# Patient Record
Sex: Female | Born: 1950
Health system: Southern US, Community
[De-identification: ages and names within clinical notes are randomized; demographics above are authoritative.]

## PROBLEM LIST (undated history)

## (undated) DIAGNOSIS — K8020 Calculus of gallbladder without cholecystitis without obstruction: Secondary | ICD-10-CM

## (undated) DIAGNOSIS — H43811 Vitreous degeneration, right eye: Secondary | ICD-10-CM

## (undated) DIAGNOSIS — K5790 Diverticulosis of intestine, part unspecified, without perforation or abscess without bleeding: Secondary | ICD-10-CM

## (undated) DIAGNOSIS — R7301 Impaired fasting glucose: Secondary | ICD-10-CM

## (undated) DIAGNOSIS — Z87898 Personal history of other specified conditions: Secondary | ICD-10-CM

## (undated) DIAGNOSIS — M1711 Unilateral primary osteoarthritis, right knee: Secondary | ICD-10-CM

## (undated) DIAGNOSIS — C4491 Basal cell carcinoma of skin, unspecified: Secondary | ICD-10-CM

## (undated) DIAGNOSIS — J309 Allergic rhinitis, unspecified: Secondary | ICD-10-CM

## (undated) DIAGNOSIS — I10 Essential (primary) hypertension: Secondary | ICD-10-CM

## (undated) DIAGNOSIS — N182 Chronic kidney disease, stage 2 (mild): Secondary | ICD-10-CM

## (undated) DIAGNOSIS — D170 Benign lipomatous neoplasm of skin and subcutaneous tissue of head, face and neck: Secondary | ICD-10-CM

## (undated) DIAGNOSIS — M65332 Trigger finger, left middle finger: Secondary | ICD-10-CM

## (undated) DIAGNOSIS — F419 Anxiety disorder, unspecified: Secondary | ICD-10-CM

## (undated) DIAGNOSIS — J302 Other seasonal allergic rhinitis: Secondary | ICD-10-CM

## (undated) DIAGNOSIS — K219 Gastro-esophageal reflux disease without esophagitis: Secondary | ICD-10-CM

## (undated) HISTORY — DX: Diverticulosis of intestine, part unspecified, without perforation or abscess without bleeding: K57.90

## (undated) HISTORY — DX: Calculus of gallbladder without cholecystitis without obstruction: K80.20

## (undated) HISTORY — DX: Vitreous degeneration, right eye: H43.811

## (undated) HISTORY — DX: Chronic kidney disease, stage 2 (mild): N18.2

## (undated) HISTORY — DX: Impaired fasting glucose: R73.01

## (undated) HISTORY — DX: Other seasonal allergic rhinitis: J30.2

## (undated) HISTORY — DX: Essential (primary) hypertension: I10

## (undated) HISTORY — DX: Basal cell carcinoma of skin, unspecified: C44.91

## (undated) HISTORY — DX: Gastro-esophageal reflux disease without esophagitis: K21.9

## (undated) HISTORY — PX: COLONOSCOPY: SHX174

## (undated) HISTORY — DX: Benign lipomatous neoplasm of skin and subcutaneous tissue of head, face and neck: D17.0

## (undated) HISTORY — DX: Trigger finger, left middle finger: M65.332

## (undated) HISTORY — DX: Allergic rhinitis, unspecified: J30.9

## (undated) HISTORY — DX: Anxiety disorder, unspecified: F41.9

## (undated) HISTORY — DX: Unilateral primary osteoarthritis, right knee: M17.11

## (undated) HISTORY — DX: Personal history of other specified conditions: Z87.898

---
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
## 1973-09-16 HISTORY — PX: PILONIDAL CYST EXCISION: SHX744

## 2008-05-05 ENCOUNTER — Other Ambulatory Visit: Admission: RE | Admit: 2008-05-05 | Discharge: 2008-05-05 | Payer: Self-pay | Admitting: Family Medicine

## 2008-05-12 ENCOUNTER — Encounter: Admission: RE | Admit: 2008-05-12 | Discharge: 2008-05-12 | Payer: Self-pay | Admitting: Family Medicine

## 2011-07-12 HISTORY — PX: OTHER SURGICAL HISTORY: SHX169

## 2011-08-05 HISTORY — PX: OTHER SURGICAL HISTORY: SHX169

## 2012-12-23 DIAGNOSIS — F419 Anxiety disorder, unspecified: Secondary | ICD-10-CM | POA: Insufficient documentation

## 2012-12-23 DIAGNOSIS — I10 Essential (primary) hypertension: Secondary | ICD-10-CM | POA: Insufficient documentation

## 2014-01-14 DIAGNOSIS — M1711 Unilateral primary osteoarthritis, right knee: Secondary | ICD-10-CM

## 2014-01-14 HISTORY — DX: Unilateral primary osteoarthritis, right knee: M17.11

## 2014-02-08 DIAGNOSIS — M1710 Unilateral primary osteoarthritis, unspecified knee: Secondary | ICD-10-CM | POA: Insufficient documentation

## 2014-02-08 DIAGNOSIS — M179 Osteoarthritis of knee, unspecified: Secondary | ICD-10-CM | POA: Insufficient documentation

## 2015-02-28 ENCOUNTER — Ambulatory Visit (INDEPENDENT_AMBULATORY_CARE_PROVIDER_SITE_OTHER): Payer: No Typology Code available for payment source | Admitting: Family Medicine

## 2015-02-28 ENCOUNTER — Encounter: Payer: Self-pay | Admitting: Family Medicine

## 2015-02-28 VITALS — BP 134/90 | HR 80 | Temp 98.0°F | Resp 16 | Ht 62.5 in | Wt 151.0 lb

## 2015-02-28 DIAGNOSIS — I10 Essential (primary) hypertension: Secondary | ICD-10-CM | POA: Diagnosis not present

## 2015-02-28 DIAGNOSIS — Z0000 Encounter for general adult medical examination without abnormal findings: Secondary | ICD-10-CM | POA: Diagnosis not present

## 2015-02-28 LAB — COMPREHENSIVE METABOLIC PANEL
ALK PHOS: 69 U/L (ref 39–117)
ALT: 27 U/L (ref 0–35)
AST: 28 U/L (ref 0–37)
Albumin: 4.6 g/dL (ref 3.5–5.2)
BILIRUBIN TOTAL: 1.2 mg/dL (ref 0.2–1.2)
BUN: 18 mg/dL (ref 6–23)
CO2: 28 mEq/L (ref 19–32)
Calcium: 9.9 mg/dL (ref 8.4–10.5)
Chloride: 106 mEq/L (ref 96–112)
Creatinine, Ser: 1.1 mg/dL (ref 0.40–1.20)
GFR: 53.04 mL/min — ABNORMAL LOW (ref >60.00)
Glucose, Bld: 79 mg/dL (ref 70–99)
POTASSIUM: 4.7 meq/L (ref 3.5–5.1)
SODIUM: 141 meq/L (ref 135–145)
TOTAL PROTEIN: 7.4 g/dL (ref 6.0–8.3)

## 2015-02-28 LAB — LIPID PANEL
CHOL/HDL RATIO: 4
Cholesterol: 171 mg/dL (ref 0–200)
HDL: 47.5 mg/dL (ref >39.00)
LDL Cholesterol: 107 mg/dL — ABNORMAL HIGH (ref 0–99)
NonHDL: 123.5
TRIGLYCERIDES: 84 mg/dL (ref 0.0–149.0)
VLDL: 16.8 mg/dL (ref 0.0–40.0)

## 2015-02-28 LAB — CBC WITH DIFFERENTIAL/PLATELET
BASOS ABS: 0 10*3/uL (ref 0.0–0.1)
Basophils Relative: 0.6 % (ref 0.0–3.0)
EOS PCT: 3.5 % (ref 0.0–5.0)
Eosinophils Absolute: 0.2 10*3/uL (ref 0.0–0.7)
HEMATOCRIT: 47.4 % — AB (ref 36.0–46.0)
Hemoglobin: 15.8 g/dL — ABNORMAL HIGH (ref 12.0–15.0)
Lymphocytes Relative: 36.4 % (ref 12.0–46.0)
Lymphs Abs: 2.1 10*3/uL (ref 0.7–4.0)
MCHC: 33.3 g/dL (ref 30.0–36.0)
MCV: 88 fl (ref 78.0–100.0)
Monocytes Absolute: 0.5 10*3/uL (ref 0.1–1.0)
Monocytes Relative: 9.4 % (ref 3.0–12.0)
Neutro Abs: 2.8 10*3/uL (ref 1.4–7.7)
Neutrophils Relative %: 50.1 % (ref 43.0–77.0)
Platelets: 336 10*3/uL (ref 150.0–400.0)
RBC: 5.38 Mil/uL — AB (ref 3.87–5.11)
RDW: 13.9 % (ref 11.5–15.5)
WBC: 5.6 10*3/uL (ref 4.0–10.5)

## 2015-02-28 LAB — TSH: TSH: 0.7 u[IU]/mL (ref 0.35–4.50)

## 2015-02-28 NOTE — Progress Notes (Signed)
Pre visit review using our clinic review tool, if applicable. No additional management support is needed unless otherwise documented below in the visit note. 

## 2015-02-28 NOTE — Progress Notes (Signed)
Office Note 02/28/2015  CC:  Chief Complaint  Patient presents with  . Establish Care    HPI:  Kelly Barnett is a 64 y.o. White female who is here to establish care. Patient's most recent primary MD: Dr. Hermine Messick in [place], [place]. Old records were not reviewed prior to or during today's visit.  She feels well. Monitors bp at home: says 130s/90 is common measurement for her. Says when bp is 115 on top she feels "lethargic".    Previous PCP put her on bp med and pt states she felt "lethargic" when bp was "normalized". She discontinued the med herself, doesn't recall the name of med.  She is not interested in med treatment of her mild HTN at this time. She does not eat low Na diet but says otherwise her diet is fairly healthy.   Past Medical History  Diagnosis Date  . Allergic rhinitis   . Hypertension   . Diverticulosis   . Lipoma of neck     R post cerv area    Past Surgical History  Procedure Laterality Date  . Eye muscle surgery Bilateral 1965  . Colonoscopy  11/07/05    Recall 10 yrs    Family History  Problem Relation Age of Onset  . Arthritis Mother   . Alcohol abuse Father   . Early death Father     ruptured blood vessel in throat  . Lung cancer Maternal Uncle   . Arthritis Maternal Grandmother   . Diabetes Maternal Grandmother   . Prostate cancer Maternal Grandfather     History   Social History  . Marital Status: Single    Spouse Name: N/A  . Number of Children: N/A  . Years of Education: N/A   Occupational History  . Not on file.   Social History Main Topics  . Smoking status: Never Smoker   . Smokeless tobacco: Never Used  . Alcohol Use: Yes  . Drug Use: No  . Sexual Activity: Not on file   Other Topics Concern  . Not on file   Social History Narrative   Single, moved to [place] from [place] in 2014 to be closer to her son and grandchildren.   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    Occ alcohol.   No tobacco or drugs.   Active in "play" but no formal exercise regimen.    Outpatient Encounter Prescriptions as of 02/28/2015  Medication Sig  . Aspirin-Caffeine (BACK & BODY EXTRA STRENGTH PO) Take by mouth.  . calcium carbonate (TUMS - DOSED IN MG ELEMENTAL CALCIUM) 500 MG chewable tablet Chew 1 tablet by mouth daily.  . magnesium oxide (MAG-OX) 400 MG tablet Take 400 mg by mouth daily.  . Multiple Vitamins-Minerals (WOMENS 50+ MULTI VITAMIN/MIN PO) Take 1 tablet by mouth daily.  . Safflower Oil (CLA) 1000 MG CAPS Take by mouth.   No facility-administered encounter medications on file as of 02/28/2015.    Allergies  Allergen Reactions  . Tetracyclines & Related Rash    ROS Review of Systems  Constitutional: Negative for fever, chills, appetite change and fatigue.  HENT: Negative for congestion, dental problem, ear pain and sore throat.   Eyes: Negative for discharge, redness and visual disturbance.  Respiratory: Negative for cough, chest tightness, shortness of breath and wheezing.   Cardiovascular: Negative for chest pain, palpitations and leg swelling.  Gastrointestinal: Negative for nausea, vomiting, abdominal pain, diarrhea and blood in stool.  Genitourinary: Negative for dysuria, urgency, frequency, hematuria, flank pain and difficulty urinating.  Musculoskeletal: Negative for myalgias, back pain, joint swelling, arthralgias and neck stiffness.  Skin: Negative for pallor and rash.  Neurological: Negative for dizziness, speech difficulty, weakness and headaches.  Hematological: Negative for adenopathy. Does not bruise/bleed easily.  Psychiatric/Behavioral: Negative for confusion and sleep disturbance. The patient is not nervous/anxious.     PE; Blood pressure 134/90, pulse 80, temperature 98 F (36.7 C), temperature source Oral, resp. rate 16, height 5' 2.5" (1.588 m), weight 151 lb (68.493 kg), SpO2 97 %. Gen: Alert, well appearing.  Patient is oriented  to person, place, time, and situation. AFFECT: pleasant, lucid thought and speech. ENT: Ears: EACs clear, normal epithelium.  TMs with good light reflex and landmarks bilaterally.  Eyes: no injection, icteris, swelling, or exudate.  EOMI, PERRLA. Nose: no drainage or turbinate edema/swelling.  No injection or focal lesion.  Mouth: lips without lesion/swelling.  Oral mucosa pink and moist.  Dentition intact and without obvious caries or gingival swelling.  Oropharynx without erythema, exudate, or swelling.  Neck: supple/nontender.  No LAD, mass, or TM.  Carotid pulses 2+ bilaterally, without bruits. CV: RRR, no m/r/g.   LUNGS: CTA bilat, nonlabored resps, good aeration in all lung fields. ABD: soft, NT, ND, BS normal.  No hepatospenomegaly or mass.  No bruits. EXT: no clubbing, cyanosis, or edema.  Musculoskeletal: no joint swelling, erythema, warmth, or tenderness.  ROM of all joints intact. Skin - no sores or suspicious lesions or rashes or color changes  Pertinent labs:  None today  ASSESSMENT AND PLAN:   New pt; obtain prior pcp records.  1) Stage I HTN, mild. OK to proceed with low Na diet, increase exercise, continue home monitoring, no meds at this time. If gets persistently >419 systolic or >37 diastolic pt agrees to another trial of medication. Check BMET today.  2) Reviewed age and gender appropriate health maintenance issues (prudent diet, regular exercise, health risks of tobacco and excessive alcohol, use of seatbelts, fire alarms in home, use of sunscreen).  Also reviewed age and gender appropriate health screening as well as vaccine recommendations. HP (fasting) labs today. No vaccines due at this time. She'll get mammogram in 08/2015, and she'll return that same month to get pap/pelvic exam here.  An After Visit Summary was printed and given to the patient.  Return in about 5 months (around 07/31/2015) for 30 min appt for pap/pelvic.

## 2015-03-13 ENCOUNTER — Encounter: Payer: Self-pay | Admitting: Internal Medicine

## 2015-07-18 ENCOUNTER — Encounter: Payer: Self-pay | Admitting: Family Medicine

## 2015-08-09 ENCOUNTER — Ambulatory Visit (INDEPENDENT_AMBULATORY_CARE_PROVIDER_SITE_OTHER): Payer: Medicare Other | Admitting: Family Medicine

## 2015-08-09 ENCOUNTER — Encounter: Payer: Self-pay | Admitting: Family Medicine

## 2015-08-09 ENCOUNTER — Other Ambulatory Visit (HOSPITAL_COMMUNITY)
Admission: RE | Admit: 2015-08-09 | Discharge: 2015-08-09 | Disposition: A | Discharge status: Home / Self Care | Payer: Medicare Other | Source: Ambulatory Visit | Attending: Family Medicine | Admitting: Family Medicine

## 2015-08-09 VITALS — BP 136/93 | HR 73 | Temp 98.3°F | Resp 16 | Ht 62.5 in | Wt 156.0 lb

## 2015-08-09 DIAGNOSIS — Z1151 Encounter for screening for human papillomavirus (HPV): Secondary | ICD-10-CM | POA: Insufficient documentation

## 2015-08-09 DIAGNOSIS — Z124 Encounter for screening for malignant neoplasm of cervix: Secondary | ICD-10-CM

## 2015-08-09 DIAGNOSIS — Z01419 Encounter for gynecological examination (general) (routine) without abnormal findings: Secondary | ICD-10-CM | POA: Diagnosis not present

## 2015-08-09 DIAGNOSIS — Z01411 Encounter for gynecological examination (general) (routine) with abnormal findings: Secondary | ICD-10-CM | POA: Diagnosis not present

## 2015-08-09 MED ORDER — ZOSTER VACCINE LIVE 19400 UNT/0.65ML ~~LOC~~ SOLR: 0.6500 mL | Freq: Once | SUBCUTANEOUS | Status: DC

## 2015-08-09 NOTE — Progress Notes (Signed)
Pre visit review using our clinic review tool, if applicable. No additional management support is needed unless otherwise documented below in the visit note. 

## 2015-08-09 NOTE — Progress Notes (Signed)
OFFICE VISIT  08/09/2015   CC:  Chief Complaint  Patient presents with  . Gynecologic Exam   HPI:    Patient is a 64 y.o. Caucasian female who presents for routine pap/pelvic. No past hx of abnormal pap smears.  She is post-menopausal.  She does not smoke. Mammogram is UTD.  She decined breast exam today.   Past Medical History  Diagnosis Date  . Allergic rhinitis   . Hypertension   . Diverticulosis   . Lipoma of neck     R post cerv area  . GERD (gastroesophageal reflux disease)   . Anxiety   . History of chest pain     Normal ETT 2012  . Right knee DJD 01/2014    per old records, x-ray showed mild tricompartmental DJD with large joint effusion.    Past Surgical History  Procedure Laterality Date  . Eye muscle surgery Bilateral 1965  . Colonoscopy  11/07/05    Recall 10 yrs  . Dexa  08/05/11    Normal  . Ett  07/12/11    Normal (at [facility]) per old records    Outpatient Prescriptions Prior to Visit  Medication Sig Dispense Refill  . Aspirin-Caffeine (BACK & BODY EXTRA STRENGTH PO) Take by mouth.    . calcium carbonate (TUMS - DOSED IN MG ELEMENTAL CALCIUM) 500 MG chewable tablet Chew 1 tablet by mouth daily.    . magnesium oxide (MAG-OX) 400 MG tablet Take 400 mg by mouth daily.    . Multiple Vitamins-Minerals (WOMENS 50+ MULTI VITAMIN/MIN PO) Take 1 tablet by mouth daily.    . Safflower Oil (CLA) 1000 MG CAPS Take by mouth.     No facility-administered medications prior to visit.    Allergies  Allergen Reactions  . Tetracyclines & Related Rash    ROS As per HPI  PE: Blood pressure 136/93, pulse 73, temperature 98.3 F (36.8 C), temperature source Oral, resp. rate 16, height 5' 2.5" (1.588 m), weight 156 lb (70.761 kg), SpO2 97 %. Gen: Alert, well appearing.  Patient is oriented to person, place, time, and situation. Vagina and vulva are normal except her introitus is narrow--able to pass the speculum through but with mild discomfort;  Slight off-white  d/c noted.  Cervix normal without lesions. Uterus anteverted and mobile, normal in size and shape without tenderness.  Adnexa normal in size without masses or tenderness. Pap Smear - is completed today. Exam chaperoned by female assistant: Sharen Hones, [place].   LABS:  none  IMPRESSION AND PLAN:  Pelvic exam with pap smear:  Specimen sent for cytology and HPV testing. If all neg, then as per latest cerv ca screening guidelines she does not need any further cervical ca screening since she has no hx of abnormal paps and no FH of cervical cancer. Pt declined breast exam today.  An After Visit Summary was printed and given to the patient.   FOLLOW UP: Return in about 7 months (around 03/08/2016) for annual CPE (fasting).

## 2015-08-15 ENCOUNTER — Encounter: Payer: Self-pay | Admitting: Family Medicine

## 2015-08-15 DIAGNOSIS — Z124 Encounter for screening for malignant neoplasm of cervix: Secondary | ICD-10-CM | POA: Insufficient documentation

## 2015-08-16 LAB — CYTOLOGY - PAP

## 2015-09-17 DIAGNOSIS — H43811 Vitreous degeneration, right eye: Secondary | ICD-10-CM

## 2015-09-17 HISTORY — DX: Vitreous degeneration, right eye: H43.811

## 2015-09-17 HISTORY — PX: CATARACT EXTRACTION W/ INTRAOCULAR LENS IMPLANT: SHX1309

## 2015-10-17 ENCOUNTER — Ambulatory Visit: Payer: Self-pay | Admitting: Family Medicine

## 2015-10-23 ENCOUNTER — Encounter: Payer: Self-pay | Admitting: Family Medicine

## 2015-10-23 ENCOUNTER — Ambulatory Visit (INDEPENDENT_AMBULATORY_CARE_PROVIDER_SITE_OTHER): Payer: Medicare Other | Admitting: Family Medicine

## 2015-10-23 ENCOUNTER — Encounter: Payer: Self-pay | Admitting: Internal Medicine

## 2015-10-23 VITALS — BP 136/91 | HR 81 | Temp 97.9°F | Resp 16 | Ht 62.5 in | Wt 159.2 lb

## 2015-10-23 DIAGNOSIS — M65842 Other synovitis and tenosynovitis, left hand: Secondary | ICD-10-CM | POA: Diagnosis not present

## 2015-10-23 DIAGNOSIS — B070 Plantar wart: Secondary | ICD-10-CM | POA: Diagnosis not present

## 2015-10-23 DIAGNOSIS — M67442 Ganglion, left hand: Secondary | ICD-10-CM | POA: Diagnosis not present

## 2015-10-23 DIAGNOSIS — Z1211 Encounter for screening for malignant neoplasm of colon: Secondary | ICD-10-CM

## 2015-10-23 NOTE — Progress Notes (Signed)
OFFICE NOTE  10/23/2015  CC:  Chief Complaint  Patient presents with  . Cyst    palm on left hand x 2-3 months  . Plantar Warts    left foot     HPI: Patient is a 65 y.o. Caucasian female who is here for painful L foot plantar wart. Also painful and bothersome swelling on palmar aspect of middle finger on left hand for the last couple months.    Pertinent PMH:  Past medical, surgical, social, and family history reviewed and no changes are noted since last office visit.  MEDS:  Outpatient Prescriptions Prior to Visit  Medication Sig Dispense Refill  . Aspirin-Caffeine (BACK & BODY EXTRA STRENGTH PO) Take by mouth.    . calcium carbonate (TUMS - DOSED IN MG ELEMENTAL CALCIUM) 500 MG chewable tablet Chew 1 tablet by mouth daily.    . magnesium oxide (MAG-OX) 400 MG tablet Take 400 mg by mouth daily.    . Multiple Vitamins-Minerals (WOMENS 50+ MULTI VITAMIN/MIN PO) Take 1 tablet by mouth daily.    Marland Kitchen zoster vaccine live, PF, (ZOSTAVAX) 28413 UNT/0.65ML injection Inject 19,400 Units into the skin once. (Patient not taking: Reported on 10/23/2015) 1 each 0   No facility-administered medications prior to visit.    PE: Blood pressure 136/91, pulse 81, temperature 97.9 F (36.6 C), temperature source Oral, resp. rate 16, height 5' 2.5" (1.588 m), weight 159 lb 4 oz (72.235 kg), SpO2 94 %. Gen: Alert, well appearing.  Patient is oriented to person, place, time, and situation. Left hand: volar surface of palm near the head of 3rd metacarpal there is a bee-bee sized, firm nodule. Mild discomfort to palpation of the nodule.  No overlying skin changes.  Left foot plantar surface near head of 4th metatarsal there is a small callus with a verrucous lesion in the center.  Mild discomfort to firm palpation over this.  IMPRESSION AND PLAN:  1) Left hand ganglion cyst/stenosing tenosynovitis of 3rd flexor tendon sheath: causing patient discomfort and has been present for 2-3 mo.  Plan: refer to  orthopedics for further eval/mgmt.  2) Left foot plantar wart: Pt desired freezing of this lesion today. After using a dermablade to remove her callus today,  Histofreeze liquid nitrogen was applied for 45 seconds to the plantar wart x 2 freeze/thaw cycles, and the expected blistering or scabbing reaction explained. Do not pick at the areas.   Return if ongoing discomfort or if lesion returns and causes symptoms again.  3) Colon ca screening: ordered referral to GI for colonoscopy b/c it has been 10 yrs since her last one (which was normal).  An After Visit Summary was printed and given to the patient.  FOLLOW UP: prn

## 2015-10-23 NOTE — Progress Notes (Signed)
Pre visit review using our clinic review tool, if applicable. No additional management support is needed unless otherwise documented below in the visit note. 

## 2015-10-26 DIAGNOSIS — Z1231 Encounter for screening mammogram for malignant neoplasm of breast: Secondary | ICD-10-CM | POA: Diagnosis not present

## 2015-11-13 ENCOUNTER — Telehealth: Payer: Self-pay | Admitting: Family Medicine

## 2015-11-13 DIAGNOSIS — B070 Plantar wart: Secondary | ICD-10-CM

## 2015-11-13 NOTE — Telephone Encounter (Signed)
Please advise. Thanks.  

## 2015-11-13 NOTE — Telephone Encounter (Signed)
I ordered a referral to Dr. Allyson Sabal, a dermatologist.

## 2015-11-13 NOTE — Telephone Encounter (Signed)
Patient still has the wart on the bottom of her foot that Dr. Anitra Lauth saw her for on last OV. She still has it should she make an appointment at [facility] or should she go to a specialist?

## 2015-11-14 NOTE — Telephone Encounter (Signed)
Pt advised and voiced understanding.   

## 2015-11-16 ENCOUNTER — Ambulatory Visit (AMBULATORY_SURGERY_CENTER): Payer: Self-pay | Admitting: *Deleted

## 2015-11-16 VITALS — Ht 62.5 in | Wt 156.6 lb

## 2015-11-16 DIAGNOSIS — Z1211 Encounter for screening for malignant neoplasm of colon: Secondary | ICD-10-CM

## 2015-11-16 MED ORDER — NA SULFATE-K SULFATE-MG SULF 17.5-3.13-1.6 GM/177ML PO SOLN: ORAL | Status: DC

## 2015-11-16 NOTE — Progress Notes (Signed)
No allergies to eggs or soy. No problems with anesthesia.  Pt given Emmi instructions for colonoscopy  No oxygen use  No diet drug use  

## 2015-11-24 DIAGNOSIS — B078 Other viral warts: Secondary | ICD-10-CM | POA: Diagnosis not present

## 2015-11-30 ENCOUNTER — Ambulatory Visit (AMBULATORY_SURGERY_CENTER): Payer: Medicare Other | Admitting: Internal Medicine

## 2015-11-30 ENCOUNTER — Encounter: Payer: Self-pay | Admitting: Internal Medicine

## 2015-11-30 VITALS — BP 123/83 | HR 75 | Temp 98.4°F | Resp 12 | Ht 62.0 in | Wt 156.0 lb

## 2015-11-30 DIAGNOSIS — D122 Benign neoplasm of ascending colon: Secondary | ICD-10-CM | POA: Diagnosis not present

## 2015-11-30 DIAGNOSIS — Z1211 Encounter for screening for malignant neoplasm of colon: Secondary | ICD-10-CM

## 2015-11-30 DIAGNOSIS — D124 Benign neoplasm of descending colon: Secondary | ICD-10-CM | POA: Diagnosis not present

## 2015-11-30 DIAGNOSIS — D123 Benign neoplasm of transverse colon: Secondary | ICD-10-CM | POA: Diagnosis not present

## 2015-11-30 DIAGNOSIS — E669 Obesity, unspecified: Secondary | ICD-10-CM | POA: Diagnosis not present

## 2015-11-30 MED ORDER — SODIUM CHLORIDE 0.9 % IV SOLN: 500.0000 mL | INTRAVENOUS | Status: DC

## 2015-11-30 NOTE — Patient Instructions (Signed)
YOU HAD AN ENDOSCOPIC PROCEDURE TODAY AT [facility]:   Refer to the procedure report that was given to you for any specific questions about what was found during the examination.  If the procedure report does not answer your questions, please call your gastroenterologist to clarify.  If you requested that your care partner not be given the details of your procedure findings, then the procedure report has been included in a sealed envelope for you to review at your convenience later.  YOU SHOULD EXPECT: Some feelings of bloating in the abdomen. Passage of more gas than usual.  Walking can help get rid of the air that was put into your GI tract during the procedure and reduce the bloating. If you had a lower endoscopy (such as a colonoscopy or flexible sigmoidoscopy) you may notice spotting of blood in your stool or on the toilet paper. If you underwent a bowel prep for your procedure, you may not have a normal bowel movement for a few days.  Please Note:  You might notice some irritation and congestion in your nose or some drainage.  This is from the oxygen used during your procedure.  There is no need for concern and it should clear up in a day or so.  SYMPTOMS TO REPORT IMMEDIATELY:   Following lower endoscopy (colonoscopy or flexible sigmoidoscopy):  Excessive amounts of blood in the stool  Significant tenderness or worsening of abdominal pains  Swelling of the abdomen that is new, acute  Fever of 100F or higher   For urgent or emergent issues, a gastroenterologist can be reached at any hour by calling [phone].   DIET: Your first meal following the procedure should be a small meal and then it is ok to progress to your normal diet. Heavy or fried foods are harder to digest and may make you feel nauseous or bloated.  Likewise, meals heavy in dairy and vegetables can increase bloating.  Drink plenty of fluids but you should avoid alcoholic beverages for 24  hours.  ACTIVITY:  You should plan to take it easy for the rest of today and you should NOT DRIVE or use heavy machinery until tomorrow (because of the sedation medicines used during the test).    FOLLOW UP: Our staff will call the number listed on your records the next business day following your procedure to check on you and address any questions or concerns that you may have regarding the information given to you following your procedure. If we do not reach you, we will leave a message.  However, if you are feeling well and you are not experiencing any problems, there is no need to return our call.  We will assume that you have returned to your regular daily activities without incident.  If any biopsies were taken you will be contacted by phone or by letter within the next 1-3 weeks.  Please call us at [phone] if you have not heard about the biopsies in 3 weeks.    SIGNATURES/CONFIDENTIALITY: You and/or your care partner have signed paperwork which will be entered into your electronic medical record.  These signatures attest to the fact that that the information above on your After Visit Summary has been reviewed and is understood.  Full responsibility of the confidentiality of this discharge information lies with you and/or your care-partner.  Please read all handouts given to you by your recovery nurse. Thank you for letting us take care of your healthcare needs today.

## 2015-11-30 NOTE — Progress Notes (Signed)
Report to PACU, RN, vss, BBS= Clear.  

## 2015-11-30 NOTE — Op Note (Signed)
Kelly Barnett Procedure Date: 11/30/2015 9:55 AM MRN: [id number] Endoscopist: Jerene Bears , MD Age: 65 Referring MD:  Date of Birth: 1951/04/30 Gender: Female Procedure:                Colonoscopy Indications:              Screening for colorectal malignant neoplasm, Last                            colonoscopy 10 years ago Medicines:                Monitored Anesthesia Care Procedure:                Pre-Anesthesia Assessment:                           - Prior to the procedure, a History and Physical                            was performed, and patient medications and                            allergies were reviewed. The patient's tolerance of                            previous anesthesia was also reviewed. The risks                            and benefits of the procedure and the sedation                            options and risks were discussed with the patient.                            All questions were answered, and informed consent                            was obtained. Prior Anticoagulants: The patient has                            taken no previous anticoagulant or antiplatelet                            agents. ASA Grade Assessment: II - A patient with                            mild systemic disease. After reviewing the risks                            and benefits, the patient was deemed in                            satisfactory condition to undergo the procedure.  After obtaining informed consent, the colonoscope                            was passed under direct vision. Throughout the                            procedure, the patient's blood pressure, pulse, and                            oxygen saturations were monitored continuously. The                            Model PCF-H190L [id number]) scope was introduced                            through the anus and advanced to the the terminal                     ileum. The colonoscopy was performed without                            difficulty. The patient tolerated the procedure                            well. The quality of the bowel preparation was                            good. [Anatomical Structures]. Scope In: 9:58:18 AM Scope Out: 10:18:21 AM Scope Withdrawal Time: 0 hours 15 minutes 10 seconds  Total Procedure Duration: 0 hours 20 minutes 3 seconds  Findings:      The perianal exam findings include non-thrombosed external hemorrhoids.      The terminal ileum appeared normal.      A 6 mm polyp was found in the ascending colon. The polyp was sessile.       The polyp was removed with a cold snare. Resection and retrieval were       complete.      Three sessile polyps were found in the descending colon (2) and       transverse colon (1). The polyps were 3 to 5 mm in size. These polyps       were removed with a cold snare. Resection and retrieval were complete.      Multiple small and large-mouthed diverticula were found in the sigmoid       colon and descending colon.      External and internal hemorrhoids were found during retroflexion. The       hemorrhoids were small. Complications:            No immediate complications. Estimated Blood Loss:     Estimated blood loss was minimal. Impression:               - The examined portion of the ileum was normal.                           - One 6 mm polyp in the ascending colon, removed  with a cold snare. Resected and retrieved.                           - Three 3 to 5 mm polyps in the descending colon                            and in the transverse colon, removed with a cold                            snare. Resected and retrieved.                           - Moderate diverticulosis in the sigmoid colon and                            in the descending colon.                           - External and internal hemorrhoids. Recommendation:           -  Patient has a contact number available for                            emergencies. The signs and symptoms of potential                            delayed complications were discussed with the                            patient. Return to normal activities tomorrow.                            Written discharge instructions were provided to the                            patient.                           - Resume previous diet.                           - Continue present medications.                           - Await pathology results.                           - Repeat colonoscopy is recommended for                            surveillance. The colonoscopy date will be                            determined after pathology results from today's  exam become available for review. Procedure Code(s):        --- Professional ---                           [phone], Colonoscopy, flexible; with removal of                            tumor(s), polyp(s), or other lesion(s) by snare                            technique CPT copyright 2016 American Medical Association. All rights reserved. Lajuan Lines. Hilarie Fredrickson, MD Jerene Bears, MD 11/30/2015 10:24:32 AM This report has been signed electronically. Number of Addenda: 0

## 2015-11-30 NOTE — Progress Notes (Signed)
Called to room to assist during endoscopic procedure.  Patient ID and intended procedure confirmed with present staff. Received instructions for my participation in the procedure from the performing physician.  

## 2015-12-01 ENCOUNTER — Telehealth: Payer: Self-pay | Admitting: *Deleted

## 2015-12-01 NOTE — Telephone Encounter (Signed)
No answer. Name identifier. Message left to call if questions or concerns. 

## 2015-12-04 ENCOUNTER — Encounter: Payer: Self-pay | Admitting: Family Medicine

## 2015-12-05 ENCOUNTER — Encounter: Payer: Self-pay | Admitting: Internal Medicine

## 2015-12-06 ENCOUNTER — Telehealth: Payer: Self-pay | Admitting: Internal Medicine

## 2015-12-06 NOTE — Telephone Encounter (Signed)
I reviewed the results with the patient.  All questions answered.  She will call back if she has additional questions and concerns.

## 2015-12-08 DIAGNOSIS — M65332 Trigger finger, left middle finger: Secondary | ICD-10-CM | POA: Diagnosis not present

## 2015-12-08 DIAGNOSIS — M79642 Pain in left hand: Secondary | ICD-10-CM | POA: Diagnosis not present

## 2015-12-21 DIAGNOSIS — B078 Other viral warts: Secondary | ICD-10-CM | POA: Diagnosis not present

## 2016-01-15 DIAGNOSIS — M65332 Trigger finger, left middle finger: Secondary | ICD-10-CM

## 2016-01-15 HISTORY — DX: Trigger finger, left middle finger: M65.332

## 2016-01-22 ENCOUNTER — Encounter: Payer: Self-pay | Admitting: Family Medicine

## 2016-01-22 ENCOUNTER — Ambulatory Visit (INDEPENDENT_AMBULATORY_CARE_PROVIDER_SITE_OTHER): Payer: Medicare Other | Admitting: Family Medicine

## 2016-01-22 VITALS — BP 137/94 | HR 82 | Temp 98.1°F | Ht 62.0 in | Wt 156.8 lb

## 2016-01-22 DIAGNOSIS — J069 Acute upper respiratory infection, unspecified: Secondary | ICD-10-CM | POA: Diagnosis not present

## 2016-01-22 DIAGNOSIS — J208 Acute bronchitis due to other specified organisms: Secondary | ICD-10-CM

## 2016-01-22 MED ORDER — PREDNISONE 20 MG PO TABS: ORAL_TABLET | ORAL | Status: DC

## 2016-01-22 MED ORDER — AZITHROMYCIN 250 MG PO TABS: ORAL_TABLET | ORAL | Status: DC

## 2016-01-22 NOTE — Progress Notes (Signed)
OFFICE VISIT  01/22/2016   CC:  Chief Complaint  Patient presents with  . URI    x5 days, dry cough, meds does not alleviate,    HPI:    Patient is a 65 y.o. Caucasian female who presents for respiratory symptoms. Onset 5 d/a, coughing a lot, hoarse voice, cough is nonproductive.  GER acting up a lot with this. Some nasal congestion.  Says she hurts all over.  T max has been 100 each day.  Some ST.  Some HA when she coughs. Very fatigued.  Dayquil and nyquil.  No appetite.  No persistent nausea, vomited only once with all this.  Some looser bowels but not watery, and nothing very frequent.  Past Medical History  Diagnosis Date  . Allergic rhinitis   . Hypertension   . Diverticulosis   . Lipoma of neck     R post cerv area  . GERD (gastroesophageal reflux disease)   . Anxiety   . History of chest pain     Normal ETT 2012  . Right knee DJD 01/2014    per old records, x-ray showed mild tricompartmental DJD with large joint effusion.  . Seasonal allergies   . Allergy     Past Surgical History  Procedure Laterality Date  . Eye muscle surgery Bilateral 1965  . Colonoscopy  11/07/05; 11/2015    2017 tubular adenoma: recall 5 yrs  . Dexa  08/05/11    Normal  . Ett  07/12/11    Normal (at [facility]) per old records  . Pilonidal cyst excision  1975  . Cesarean section  1978, 1980    Outpatient Prescriptions Prior to Visit  Medication Sig Dispense Refill  . calcium carbonate (TUMS - DOSED IN MG ELEMENTAL CALCIUM) 500 MG chewable tablet Chew 1 tablet by mouth daily.    . Aspirin-Caffeine (BACK & BODY EXTRA STRENGTH PO) Take by mouth. Reported on 01/22/2016    . magnesium oxide (MAG-OX) 400 MG tablet Take 400 mg by mouth daily. Reported on 01/22/2016    . Multiple Vitamins-Minerals (WOMENS 50+ MULTI VITAMIN/MIN PO) Take 1 tablet by mouth daily. Reported on 01/22/2016    . NON FORMULARY Alive Liquid Fiber with Prebiotic. Takes 1 tablespoon daily     No facility-administered medications  prior to visit.    Allergies  Allergen Reactions  . Tetracyclines & Related Rash    ROS As per HPI  PE: Blood pressure 137/94, pulse 82, temperature 98.1 F (36.7 C), temperature source Oral, height 5\' 2"  (1.575 m), weight 156 lb 12.8 oz (71.124 kg), SpO2 97 %. VS: noted--normal. Gen: alert, NAD, NONTOXIC APPEARING. HEENT: eyes without injection, drainage, or swelling.  Ears: EACs clear, TMs with normal light reflex and landmarks.  Nose: Clear rhinorrhea, with some dried, crusty exudate adherent to mildly injected mucosa.  No purulent d/c.  No paranasal sinus TTP.  No facial swelling.  Throat and mouth without focal lesion.  No pharyngial swelling, erythema, or exudate.   Neck: supple, no LAD.   LUNGS: CTA bilat, nonlabored resps.   CV: RRR, no m/r/g. EXT: no c/c/e SKIN: no rash  LABS:  none  IMPRESSION AND PLAN:  URI with acute bronchtis, worsening every day. Possibly bacterial etiology. Zpack rx'd. Prednisone 40mg  qd x 5d. Get otc generic robitussin DM OR Mucinex DM and use as directed on the packaging for cough and congestion. Use otc generic saline nasal spray 2-3 times per day to irrigate/moisturize your nasal passages.  An After Visit Summary  was printed and given to the patient.  FOLLOW UP: Return if symptoms worsen or fail to improve.  Signed:  Crissie Sickles, MD           01/22/2016

## 2016-01-22 NOTE — Patient Instructions (Signed)
Get otc generic robitussin DM OR Mucinex DM and use as directed on the packaging for cough and congestion. Use otc generic saline nasal spray 2-3 times per day to irrigate/moisturize your nasal passages.   

## 2016-01-24 ENCOUNTER — Encounter: Payer: Self-pay | Admitting: Family Medicine

## 2016-04-02 DIAGNOSIS — H52221 Regular astigmatism, right eye: Secondary | ICD-10-CM | POA: Diagnosis not present

## 2016-04-02 DIAGNOSIS — H5213 Myopia, bilateral: Secondary | ICD-10-CM | POA: Diagnosis not present

## 2016-04-02 DIAGNOSIS — H524 Presbyopia: Secondary | ICD-10-CM | POA: Diagnosis not present

## 2016-04-02 DIAGNOSIS — H04123 Dry eye syndrome of bilateral lacrimal glands: Secondary | ICD-10-CM | POA: Diagnosis not present

## 2016-04-02 DIAGNOSIS — H25011 Cortical age-related cataract, right eye: Secondary | ICD-10-CM | POA: Diagnosis not present

## 2016-04-18 DIAGNOSIS — H52203 Unspecified astigmatism, bilateral: Secondary | ICD-10-CM | POA: Diagnosis not present

## 2016-04-18 DIAGNOSIS — H25813 Combined forms of age-related cataract, bilateral: Secondary | ICD-10-CM | POA: Diagnosis not present

## 2016-04-18 DIAGNOSIS — H53001 Unspecified amblyopia, right eye: Secondary | ICD-10-CM | POA: Diagnosis not present

## 2016-04-18 DIAGNOSIS — H43811 Vitreous degeneration, right eye: Secondary | ICD-10-CM | POA: Diagnosis not present

## 2016-05-07 DIAGNOSIS — I10 Essential (primary) hypertension: Secondary | ICD-10-CM | POA: Diagnosis not present

## 2016-05-07 DIAGNOSIS — H25811 Combined forms of age-related cataract, right eye: Secondary | ICD-10-CM | POA: Diagnosis not present

## 2016-05-07 DIAGNOSIS — F419 Anxiety disorder, unspecified: Secondary | ICD-10-CM | POA: Diagnosis not present

## 2016-05-07 DIAGNOSIS — K219 Gastro-esophageal reflux disease without esophagitis: Secondary | ICD-10-CM | POA: Diagnosis not present

## 2016-05-07 DIAGNOSIS — Z79899 Other long term (current) drug therapy: Secondary | ICD-10-CM | POA: Diagnosis not present

## 2016-05-09 ENCOUNTER — Encounter: Payer: Self-pay | Admitting: Family Medicine

## 2016-09-16 DIAGNOSIS — N182 Chronic kidney disease, stage 2 (mild): Secondary | ICD-10-CM

## 2016-09-16 HISTORY — DX: Chronic kidney disease, stage 2 (mild): N18.2

## 2016-10-31 DIAGNOSIS — Z1231 Encounter for screening mammogram for malignant neoplasm of breast: Secondary | ICD-10-CM | POA: Diagnosis not present

## 2016-10-31 LAB — HM MAMMOGRAPHY

## 2016-11-01 ENCOUNTER — Encounter: Payer: Self-pay | Admitting: Family Medicine

## 2017-01-14 DIAGNOSIS — R7303 Prediabetes: Secondary | ICD-10-CM

## 2017-01-14 HISTORY — DX: Prediabetes: R73.03

## 2017-01-22 ENCOUNTER — Encounter: Payer: Self-pay | Admitting: Family Medicine

## 2017-01-22 ENCOUNTER — Ambulatory Visit (INDEPENDENT_AMBULATORY_CARE_PROVIDER_SITE_OTHER): Payer: Medicare Other | Admitting: Family Medicine

## 2017-01-22 VITALS — BP 136/87 | HR 88 | Temp 98.0°F | Resp 16 | Ht 62.0 in | Wt 161.0 lb

## 2017-01-22 DIAGNOSIS — F4323 Adjustment disorder with mixed anxiety and depressed mood: Secondary | ICD-10-CM | POA: Diagnosis not present

## 2017-01-22 DIAGNOSIS — Z1159 Encounter for screening for other viral diseases: Secondary | ICD-10-CM

## 2017-01-22 DIAGNOSIS — R42 Dizziness and giddiness: Secondary | ICD-10-CM

## 2017-01-22 DIAGNOSIS — I10 Essential (primary) hypertension: Secondary | ICD-10-CM | POA: Diagnosis not present

## 2017-01-22 MED ORDER — IRBESARTAN 75 MG PO TABS: 75.0000 mg | ORAL_TABLET | Freq: Every day | ORAL | 0 refills | Status: DC

## 2017-01-22 NOTE — Progress Notes (Signed)
OFFICE VISIT  01/22/2017   CC:  Chief Complaint  Patient presents with  . Elevated BP   HPI:    Patient is a 66 y.o.  female who presents for concern of elevated bp's recently. She does carry a dx of HTN but when she became my pt back in 2016 she declined any trial of bp medication b/c a recent trial of BP med with her prior PCP caused her to feel lethargic with normal bp (not hypotension). She could not recall which bp med was tried.  Getting more stressed a lot lately.  Things bother her more than they used to.   The last 10d, she checks bp on wrist cuff and gets 150s-170s/90s-100s.  Having some vague intermittent tingling sensation in skin of arms, some mild lightheaded feeling, some HAs lately.  No vertigo.  Has some mild pain between shoulder blades lately.  No focal weakness, no CP, no SOB. Used to run bp's around 140/90 or less. No otc decongestants or NSAIDs.  No signif increase in her caffeine intake. Has GER which tums helps.   Past Medical History:  Diagnosis Date  . Allergic rhinitis   . Allergy   . Anxiety   . Diverticulosis   . GERD (gastroesophageal reflux disease)   . History of chest pain    Normal ETT 2012  . Hypertension   . Lipoma of neck    R post cerv area  . Posterior vitreous detachment of right eye 2017   Dr. Larose Kells  . Right knee DJD 01/2014   per old records, x-ray showed mild tricompartmental DJD with large joint effusion.  . Seasonal allergies   . Trigger middle finger of left hand 01/2016   Dr. Amedeo Plenty aspirated/injected this    Past Surgical History:  Procedure Laterality Date  . [place]  . COLONOSCOPY  11/07/05; 11/2015   2017 tubular adenoma: recall 5 yrs  . DEXA  08/05/11   Normal  . ETT  07/12/11   [facility] ([facility] records  . EYE MUSCLE SURGERY Bilateral 1965  . PILONIDAL CYST EXCISION  1975    Outpatient Medications Prior to Visit  Medication Sig Dispense Refill  . Aspirin-Caffeine (BACK & BODY EXTRA  STRENGTH PO) Take by mouth. Reported on 01/22/2016    . calcium carbonate (TUMS - DOSED IN MG ELEMENTAL CALCIUM) 500 MG chewable tablet Chew 1 tablet by mouth daily.    . Multiple Vitamins-Minerals (WOMENS 50+ MULTI VITAMIN/MIN PO) Take 1 tablet by mouth daily. Reported on 01/22/2016    . azithromycin (ZITHROMAX) 250 MG tablet 2 tabs po qd x 1d, then 1 tab po qd x 4d (Patient not taking: Reported on 01/22/2017) 6 tablet 0  . magnesium oxide (MAG-OX) 400 MG tablet Take 400 mg by mouth daily. Reported on 01/22/2016    . predniSONE (DELTASONE) 20 MG tablet 2 tabs po qd x 5d (Patient not taking: Reported on 01/22/2017) 10 tablet 0   No facility-administered medications prior to visit.     Allergies  Allergen Reactions  . Tetracyclines & Related Rash    ROS As per HPI  PE: Blood pressure 136/87, pulse 88, temperature 98 F (36.7 C), temperature source Oral, resp. rate 16, height 5\' 2"  (1.575 m), weight 161 lb (73 kg), SpO2 97 %. Gen: Alert, well appearing.  Patient is oriented to person, place, time, and situation. AFFECT: pleasant, lucid thought and speech. CV: RRR, no m/r/g.   LUNGS: CTA bilat, nonlabored resps,  good aeration in all lung fields. ABD: soft, NT/ND EXT: no clubbing, cyanosis, or edema.  BACK nontender over scapulae and in mid back paraspinous soft tissues   LABS:    Chemistry      Component Value Date/Time   NA 141 02/28/2015 1222   K 4.7 02/28/2015 1222   CL 106 02/28/2015 1222   CO2 28 02/28/2015 1222   BUN 18 02/28/2015 1222   CREATININE 1.10 02/28/2015 1222      Component Value Date/Time   CALCIUM 9.9 02/28/2015 1222   ALKPHOS 69 02/28/2015 1222   AST 28 02/28/2015 1222   ALT 27 02/28/2015 1222   BILITOT 1.2 02/28/2015 1222     IMPRESSION AND PLAN:  1) Uncontrolled HTN; she is now ready to start medication treatment for this.  Her vague, intermittent physical complaints are likely from her poorly controlled HTN plus her excessive stress lately. Start  irbesartan 75mg  qd.  Therapeutic expectations and side effect profile of medication discussed today.  Patient's questions answered. Monitor bp once daily.  Goal bp <130/80. Check CMET and TSH today.  2) Adjustment d/o with mixed anxiety and depression. Encouragement and support given today.    An After Visit Summary was printed and given to the patient.  FOLLOW UP: Return in about 2 weeks (around 02/05/2017) for f/u stress/HTN.  Signed:  Crissie Sickles, MD           01/22/2017

## 2017-01-23 ENCOUNTER — Encounter: Payer: Self-pay | Admitting: Family Medicine

## 2017-01-23 LAB — BASIC METABOLIC PANEL
BUN: 16 mg/dL (ref 6–23)
CALCIUM: 9.9 mg/dL (ref 8.4–10.5)
CHLORIDE: 104 meq/L (ref 96–112)
CO2: 29 meq/L (ref 19–32)
CREATININE: 0.95 mg/dL (ref 0.40–1.20)
GFR: 62.45 mL/min (ref >60.00)
GLUCOSE: 108 mg/dL — AB (ref 70–99)
Potassium: 4.4 mEq/L (ref 3.5–5.1)
Sodium: 141 mEq/L (ref 135–145)

## 2017-01-23 LAB — TSH: TSH: 0.81 u[IU]/mL (ref 0.35–4.50)

## 2017-01-23 LAB — HEPATITIS C ANTIBODY: HCV AB: NEGATIVE

## 2017-01-24 ENCOUNTER — Other Ambulatory Visit (INDEPENDENT_AMBULATORY_CARE_PROVIDER_SITE_OTHER): Payer: Medicare Other

## 2017-01-24 DIAGNOSIS — R7301 Impaired fasting glucose: Secondary | ICD-10-CM

## 2017-01-24 LAB — HEMOGLOBIN A1C: Hgb A1c MFr Bld: 6 % (ref 4.6–6.5)

## 2017-02-05 ENCOUNTER — Ambulatory Visit (INDEPENDENT_AMBULATORY_CARE_PROVIDER_SITE_OTHER): Payer: Medicare Other | Admitting: Family Medicine

## 2017-02-05 ENCOUNTER — Encounter: Payer: Self-pay | Admitting: Family Medicine

## 2017-02-05 VITALS — BP 124/83 | HR 78 | Temp 98.0°F | Resp 16 | Wt 159.0 lb

## 2017-02-05 DIAGNOSIS — I10 Essential (primary) hypertension: Secondary | ICD-10-CM

## 2017-02-05 DIAGNOSIS — K2100 Gastro-esophageal reflux disease with esophagitis, without bleeding: Secondary | ICD-10-CM

## 2017-02-05 DIAGNOSIS — R7301 Impaired fasting glucose: Secondary | ICD-10-CM | POA: Diagnosis not present

## 2017-02-05 MED ORDER — PANTOPRAZOLE SODIUM 40 MG PO TBEC: 40.0000 mg | DELAYED_RELEASE_TABLET | Freq: Every day | ORAL | 3 refills | Status: DC

## 2017-02-05 MED ORDER — IRBESARTAN 75 MG PO TABS: 75.0000 mg | ORAL_TABLET | Freq: Every day | ORAL | 1 refills | Status: DC

## 2017-02-05 NOTE — Progress Notes (Signed)
OFFICE VISIT  02/05/2017   CC:  Chief Complaint  Patient presents with  . Hypertension    follow up   HPI:    Patient is a 66 y.o.  female who presents for 2 week f/u HTN and adjustment disorder with mixed anxiety and depressed mood. I started her on irbesartan 75 mg qd last visit. She feels like this is making her GER worse.  She feels some substernal burning/fullness after eating, plus some mild ST and frequent need to clear throat or cough up some phlegm from throat.    Her home bp's show avg syst high 120s, diast high 80s avg.  HR avg 80s. Exercise: she is trying to get started with more regular walking regimen. Diet is good.  We reviewed her labs from last visit; very mild IFG, and HbA1c of 6%.  Plan is to continue to try to increase exercise and to cut back on simple carbs and high fat foods.  Gave emotional support for her anxiety but no med started at last visit.  She has no complaint of this problem today.  Past Medical History:  Diagnosis Date  . Allergic rhinitis   . Anxiety   . Diverticulosis   . GERD (gastroesophageal reflux disease)   . History of chest pain    Normal ETT 2012  . Hypertension   . IFG (impaired fasting glucose) 01/2017   Mild.  Hba1c pending  . Lipoma of neck    R post cerv area  . Posterior vitreous detachment of right eye 2017   Dr. Larose Kells  . Right knee DJD 01/2014   per old records, x-ray showed mild tricompartmental DJD with large joint effusion.  . Seasonal allergies   . Trigger middle finger of left hand 01/2016   Dr. Amedeo Plenty aspirated/injected this    Past Surgical History:  Procedure Laterality Date  . [place]  . COLONOSCOPY  11/07/05; 11/2015   2017 tubular adenoma: recall 5 yrs  . DEXA  08/05/11   Normal  . ETT  07/12/11   Normal (at [facility]) per old records  . EYE MUSCLE SURGERY Bilateral 1965  . PILONIDAL CYST EXCISION  1975    Outpatient Medications Prior to Visit  Medication Sig Dispense Refill  .  Aspirin-Caffeine (BACK & BODY EXTRA STRENGTH PO) Take by mouth. Reported on 01/22/2016    . calcium carbonate (TUMS - DOSED IN MG ELEMENTAL CALCIUM) 500 MG chewable tablet Chew 1 tablet by mouth daily.    . Multiple Vitamins-Minerals (WOMENS 50+ MULTI VITAMIN/MIN PO) Take 1 tablet by mouth daily. Reported on 01/22/2016    . irbesartan (AVAPRO) 75 MG tablet Take 1 tablet (75 mg total) by mouth daily. 30 tablet 0   No facility-administered medications prior to visit.     Allergies  Allergen Reactions  . Tetracyclines & Related Rash    ROS As per HPI  PE: Blood pressure 124/83, pulse 78, temperature 98 F (36.7 C), temperature source Oral, resp. rate 16, weight 159 lb (72.1 kg), SpO2 98 %. Body mass index is 29.08 kg/m.  Gen: Alert, well appearing.  Patient is oriented to person, place, time, and situation. CV: RRR, no m/r/g.   LUNGS: CTA bilat, nonlabored resps, good aeration in all lung fields.   LABS:    Chemistry      Component Value Date/Time   NA 141 01/22/2017 1541   K 4.4 01/22/2017 1541   CL 104 01/22/2017 1541   CO2 29 01/22/2017 1541  BUN 16 01/22/2017 1541   CREATININE 0.95 01/22/2017 1541      Component Value Date/Time   CALCIUM 9.9 01/22/2017 1541   ALKPHOS 69 02/28/2015 1222   AST 28 02/28/2015 1222   ALT 27 02/28/2015 1222   BILITOT 1.2 02/28/2015 1222     Lab Results  Component Value Date   TSH 0.81 01/22/2017   Lab Results  Component Value Date   HGBA1C 6.0 01/24/2017    IMPRESSION AND PLAN:  1) HTN; well controlled on irbesartan 75mg  daily. Continue this med and continue bp monitoring at home a couple days a week. Call if bp persistently >140/90.  2) GERD: chronic, possibly made worse by recent addition of irbesartan. Discussed GER diet, elevate head of bed, start pantoprazole 40mg  qAM. Therapeutic expectations and side effect profile of medication discussed today.  Patient's questions answered.  3) IFG/Insulin resistance: TLC/wt loss is  biggest recommendation at this time. We'll recheck labs in 6 mo.  An After Visit Summary was printed and given to the patient.  FOLLOW UP: Return in about 6 months (around 08/08/2017) for routine chronic illness f/u.  Signed:  Crissie Sickles, MD           02/05/2017

## 2017-08-04 NOTE — Progress Notes (Signed)
Subjective:   Kelly Barnett is a 66 y.o. female who presents for an Initial Medicare Annual Wellness Visit.  Works full time Firefighter.   Review of Systems    No ROS.  Medicare Wellness Visit. Additional risk factors are reflected in the social history.  Cardiac Risk Factors include: advanced age (>34men, >43 women);hypertension;sedentary lifestyle   Sleep patterns: Sleeps 7 hours.  Home Safety/Smoke Alarms: Feels safe in home. Smoke alarms in place.  Living environment; residence and Firearm Safety: Lives alone in 1 story home. Seat Belt Safety/Bike Helmet: Wears seat belt.   Female:   Pap-08/09/2015       Mammo-10/31/2016, benign.        Dexa scan-08/05/2011, normal. Order placed. [place].         [facility]-Colonoscopy 11/30/2015, polyps. Recall 5 years.        Objective:    Today's Vitals   08/05/17 0916  BP: 130/80  Pulse: 74  Resp: 18  Temp: 98.1 F (36.7 C)  TempSrc: Oral  SpO2: 98%  Weight: 164 lb (74.4 kg)  Height: 5\' 2"  (1.575 m)  PainSc: 4    Body mass index is 30 kg/m.   Current Medications (verified) Outpatient Encounter Medications as of 08/05/2017  Medication Sig  . Aspirin-Caffeine (BACK & BODY EXTRA STRENGTH PO) Take by mouth. Reported on 01/22/2016  . Calcium Carb-Cholecalciferol (CALCIUM 600+D) 600-800 MG-UNIT TABS Take by mouth.  . irbesartan (AVAPRO) 75 MG tablet Take 1 tablet (75 mg total) by mouth daily.  . Multiple Vitamins-Minerals (WOMENS 50+ MULTI VITAMIN/MIN PO) Take 1 tablet by mouth daily. Reported on 01/22/2016  . pantoprazole (PROTONIX) 40 MG tablet Take 1 tablet (40 mg total) by mouth daily.  . calcium carbonate (TUMS - DOSED IN MG ELEMENTAL CALCIUM) 500 MG chewable tablet Chew 1 tablet by mouth daily.   No facility-administered encounter medications on file as of 08/05/2017.     Allergies (verified) Tetracyclines & related   History: Past Medical History:  Diagnosis Date  . Allergic rhinitis   . Anxiety   .  Diverticulosis   . GERD (gastroesophageal reflux disease)   . History of chest pain    Normal ETT 2012  . Hypertension   . IFG (impaired fasting glucose) 01/2017   Mild.  Hba1c pending  . Lipoma of neck    R post cerv area  . Posterior vitreous detachment of right eye 2017   Dr. Larose Kells  . Right knee DJD 01/2014   per old records, x-ray showed mild tricompartmental DJD with large joint effusion.  . Seasonal allergies   . Trigger middle finger of left hand 01/2016   Dr. Amedeo Plenty aspirated/injected this   Past Surgical History:  Procedure Laterality Date  . CATARACT EXTRACTION W/ INTRAOCULAR LENS IMPLANT Right 09/17/2015  . [place]  . COLONOSCOPY  11/07/05; 11/2015   2017 tubular adenoma: recall 5 yrs  . DEXA  08/05/11   Normal  . ETT  07/12/11   Normal (at [facility]) per old records  . EYE MUSCLE SURGERY Bilateral 1965  . PILONIDAL CYST EXCISION  1975   Family History  Problem Relation Age of Onset  . Arthritis Mother   . Alcohol abuse Father   . Early death Father        ruptured blood vessel in throat  . Lung cancer Maternal Uncle   . Arthritis Maternal Grandmother   . Diabetes Maternal Grandmother   . Prostate cancer Maternal Grandfather   .  Leukemia Other   . Colon cancer Neg Hx   . Esophageal cancer Neg Hx   . Rectal cancer Neg Hx   . Stomach cancer Neg Hx    Social History   Occupational History  . Not on file  Tobacco Use  . Smoking status: Never Smoker  . Smokeless tobacco: Never Used  Substance and Sexual Activity  . Alcohol use: Yes    Alcohol/week: 0.0 oz    Comment: rare alcohol intake  . Drug use: No  . Sexual activity: Not on file    Tobacco Counseling Counseling given: Not Answered   Activities of Daily Living In your present state of health, do you have any difficulty performing the following activities: 08/05/2017  Hearing? N  Vision? N  Difficulty concentrating or making decisions? N  Walking or climbing stairs? N    Dressing or bathing? N  Doing errands, shopping? N  Preparing Food and eating ? N  Using the Toilet? N  In the past six months, have you accidently leaked urine? N  Do you have problems with loss of bowel control? N  Managing your Medications? N  Managing your Finances? N  Housekeeping or managing your Housekeeping? N  Some recent data might be hidden    Immunizations and Health Maintenance Immunization History  Administered Date(s) Administered  . Tdap 07/31/2011   There are no preventive care reminders to display for this patient.  Patient Care Team: Tammi Sou, MD as PCP - General (Family Medicine) McGowen, Adrian Blackwater, MD (Family Medicine) Druscilla Brownie, MD as Consulting Physician (Dermatology) Roseanne Kaufman, MD as Consulting Physician (Orthopedic Surgery) Leonia Corona, MD as Consulting Physician (Ophthalmology)  Indicate any recent [place] you may have received from other than [facility] providers in the past year (date may be approximate).     Assessment:   This is a routine wellness examination for Kelly Barnett. Physical assessment deferred to PCP.   Hearing/Vision screen Hearing Screening Comments: Able to hear conversational tones w/o difficulty. No issues reported.   Vision Screening Comments: Last exam 09/16/2016. H/O cataract surgery. [facility] [place].   Dietary issues and exercise activities discussed: Current Exercise Habits: The patient does not participate in regular exercise at present(Walks occasionally), Exercise limited by: None identified   Diet (meal preparation, eat out, water intake, caffeinated beverages, dairy products, fruits and vegetables): Drinks water, coffee and un-sweet tea  Breakfast: oatmeal, fruit Lunch: cheese/crackers, cottage cheese Dinner: Hot dog, frozen meal, frozen vegetables      Goals      Patient Stated   . Patient Stated (pt-stated)     Maintain current health.      Depression Screen PHQ 2/9  Scores 08/05/2017 08/09/2015  PHQ - 2 Score 0 0    Fall Risk Fall Risk  08/05/2017 08/09/2015  Falls in the past year? No No    Cognitive Function: MMSE - Mini Mental State Exam 08/05/2017  Orientation to time 5  Orientation to Place 5  Registration 3  Attention/ Calculation 5  Recall 2  Language- name 2 objects 2  Language- repeat 1  Language- follow 3 step command 3  Language- read & follow direction 1  Write a sentence 1  Copy design 1  Total score 29        Screening Tests Health Maintenance  Topic Date Due  . INFLUENZA VACCINE  12/29/2017 (Originally 04/16/2017)  . PNA vac Low Risk Adult (1 of 2 - PCV13) 08/05/2018 (Originally 07/04/2015)  . MAMMOGRAM  10/31/2017  . COLONOSCOPY  11/29/2020  . TETANUS/TDAP  07/30/2021  . DEXA SCAN  Completed  . Hepatitis C Screening  Completed   Declines vaccines (Flu, Pneumonia and Shingles)    Plan:    Schedule bone scan with mammogram after 10/31/17.  Schedule eye exam.   Bring a copy of your living will and/or healthcare power of attorney to your next office visit.  Continue doing brain stimulating activities (puzzles, reading, adult coloring books, staying active) to keep memory sharp.    I have personally reviewed and noted the following in the patient's chart:   . Medical and social history . Use of alcohol, tobacco or illicit drugs  . Current medications and supplements . Functional ability and status . Nutritional status . Physical activity . Advanced directives . List of other physicians . Hospitalizations, surgeries, and ER visits in previous 12 months . Vitals . Screenings to include cognitive, depression, and falls . Referrals and appointments  In addition, I have reviewed and discussed with patient certain preventive protocols, quality metrics, and best practice recommendations. A written personalized care plan for preventive services as well as general preventive health recommendations were provided to  patient.     Gerilyn Nestle, RN   08/05/2017

## 2017-08-05 ENCOUNTER — Encounter: Payer: Self-pay | Admitting: Family Medicine

## 2017-08-05 ENCOUNTER — Other Ambulatory Visit: Payer: Self-pay

## 2017-08-05 ENCOUNTER — Ambulatory Visit: Payer: Self-pay

## 2017-08-05 ENCOUNTER — Ambulatory Visit (INDEPENDENT_AMBULATORY_CARE_PROVIDER_SITE_OTHER): Payer: Medicare Other | Admitting: Family Medicine

## 2017-08-05 VITALS — BP 130/80 | HR 74 | Temp 98.1°F | Resp 18 | Ht 62.0 in | Wt 164.0 lb

## 2017-08-05 DIAGNOSIS — R7301 Impaired fasting glucose: Secondary | ICD-10-CM

## 2017-08-05 DIAGNOSIS — E2839 Other primary ovarian failure: Secondary | ICD-10-CM

## 2017-08-05 DIAGNOSIS — K219 Gastro-esophageal reflux disease without esophagitis: Secondary | ICD-10-CM | POA: Diagnosis not present

## 2017-08-05 DIAGNOSIS — M654 Radial styloid tenosynovitis [de Quervain]: Secondary | ICD-10-CM

## 2017-08-05 DIAGNOSIS — Z0000 Encounter for general adult medical examination without abnormal findings: Secondary | ICD-10-CM

## 2017-08-05 DIAGNOSIS — I10 Essential (primary) hypertension: Secondary | ICD-10-CM

## 2017-08-05 MED ORDER — MELOXICAM 15 MG PO TABS: ORAL_TABLET | ORAL | 3 refills | Status: DC

## 2017-08-05 NOTE — Progress Notes (Signed)
OFFICE VISIT  08/05/2017   CC:  Chief Complaint  Patient presents with  . Medicare Wellness  RCI f/u  HPI:    Patient is a 66 y.o.  female who presents for 6 mo f/u HTN, GERD, IFG.  HTN: bp consistently normal at home checks.   No HAs or palpitations or CP.  GERD: well controlled on daily PPI and some minor dietary adjustments she has been able to make.  IFG: making minor changes---less simple sugars and trans fats. Not much exercise.  She is frustrated b/c she gained 5 lbs since last visit here 6 mo ago.  Says she is having more joint pains lately, mainly hands/fingers, knees, feet. Some stiffness on/off in daytime.  No joint swelling, warmth, or redness. Some vague tingling sensation in hands and feet intermittently.  NO clear trigger, but she does feel this more when her joints seem to be aching more.  She takes bayer back and body rarely and this does help some. She has particularly bad pain the last few weeks on base of L thumb, worse with palpation over the region and with moving thumb a lot.  No swelling, redness, or warmth.  Past Medical History:  Diagnosis Date  . Allergic rhinitis   . Anxiety   . Diverticulosis   . GERD (gastroesophageal reflux disease)   . History of chest pain    Normal ETT 2012  . Hypertension   . IFG (impaired fasting glucose) 01/2017   Mild.  Hba1c pending  . Lipoma of neck    R post cerv area  . Posterior vitreous detachment of right eye 2017   Dr. Larose Kells  . Right knee DJD 01/2014   per old records, x-ray showed mild tricompartmental DJD with large joint effusion.  . Seasonal allergies   . Trigger middle finger of left hand 01/2016   Dr. Amedeo Plenty aspirated/injected this    Past Surgical History:  Procedure Laterality Date  . CATARACT EXTRACTION W/ INTRAOCULAR LENS IMPLANT Right 09/17/2015  . [place]  . COLONOSCOPY  11/07/05; 11/2015   2017 tubular adenoma: recall 5 yrs  . DEXA  08/05/11   Normal  . ETT   07/12/11   Normal (at [facility]) per old records  . EYE MUSCLE SURGERY Bilateral 1965  . PILONIDAL CYST EXCISION  1975    Outpatient Medications Prior to Visit  Medication Sig Dispense Refill  . Aspirin-Caffeine (BACK & BODY EXTRA STRENGTH PO) Take by mouth. Reported on 01/22/2016    . Calcium Carb-Cholecalciferol (CALCIUM 600+D) 600-800 MG-UNIT TABS Take by mouth.    . irbesartan (AVAPRO) 75 MG tablet Take 1 tablet (75 mg total) by mouth daily. 90 tablet 1  . Multiple Vitamins-Minerals (WOMENS 50+ MULTI VITAMIN/MIN PO) Take 1 tablet by mouth daily. Reported on 01/22/2016    . pantoprazole (PROTONIX) 40 MG tablet Take 1 tablet (40 mg total) by mouth daily. 90 tablet 3  . calcium carbonate (TUMS - DOSED IN MG ELEMENTAL CALCIUM) 500 MG chewable tablet Chew 1 tablet by mouth daily.     No facility-administered medications prior to visit.     Allergies  Allergen Reactions  . Tetracyclines & Related Rash    ROS As per HPI  PE: Blood pressure 130/80, pulse 74, temperature 98.1 F (36.7 C), temperature source Oral, resp. rate 18, height 5\' 2"  (1.575 m), weight 164 lb (74.4 kg), SpO2 98 %. Gen: Alert, well appearing.  Patient is oriented to person, place, time, and situation. AFFECT:  pleasant, lucid thought and speech. Musculoskeletal: no joint swelling, erythema, warmth, or tenderness.  ROM of all joints intact. She has focal TTP over the left thumb extensor tendon complex overlying lateral aspect of 1st metacarpal. +Finkelsteins on L.    LABS:    Chemistry      Component Value Date/Time   NA 141 01/22/2017 1541   K 4.4 01/22/2017 1541   CL 104 01/22/2017 1541   CO2 29 01/22/2017 1541   BUN 16 01/22/2017 1541   CREATININE 0.95 01/22/2017 1541      Component Value Date/Time   CALCIUM 9.9 01/22/2017 1541   ALKPHOS 69 02/28/2015 1222   AST 28 02/28/2015 1222   ALT 27 02/28/2015 1222   BILITOT 1.2 02/28/2015 1222     Lab Results  Component Value Date   WBC 5.6 02/28/2015   HGB  15.8 (H) 02/28/2015   HCT 47.4 (H) 02/28/2015   MCV 88.0 02/28/2015   PLT 336.0 02/28/2015   Lab Results  Component Value Date   CHOL 171 02/28/2015   HDL 47.50 02/28/2015   LDLCALC 107 (H) 02/28/2015   TRIG 84.0 02/28/2015   CHOLHDL 4 02/28/2015   Lab Results  Component Value Date   HGBA1C 6.0 01/24/2017    IMPRESSION AND PLAN:  1) Osteoarthritis: encouraged increased exercise, preferably non wt-bearing. Meloxicam 15mg , 1 tab qd prn.  No more bayer or otc pain med.  2) De Quervain's tenosynovitis on left: fitted, dispensed left wrist/thumb spica splint today. Wear hs and 2-4 hours at a time during day.    3) IFG: continue TLC.  Recheck HbA1c with upcoming fasting labs.  4) HTN: The current medical regimen is effective;  continue present plan and medications. Lytes/cr with upcoming fasting labs. She'll return in near future for FLP as well.  5) GERD: The current medical regimen is effective;  continue present plan and medications.  All future labs ordered today.  FOLLOW UP: Return in about 6 months (around 02/02/2018) for routine chronic illness f/u.  Signed:  Crissie Sickles, MD           08/05/2017

## 2017-08-05 NOTE — Progress Notes (Signed)
AWV reviewed and agree.  Signed:  Crissie Sickles, MD           08/05/2017

## 2017-08-05 NOTE — Patient Instructions (Addendum)
Left thumb diagnosis: De Quervain's tenosynovitis.  Wrist splint nightly, plus for several hours per day over the next 2-3 weeks should be helpful.  Also, may use meloxicam '15mg'$  daily for pain/stiffness ---I sent this rx in to your pharmacy today.   Schedule bone scan with mammogram after 10/31/17.  Schedule eye exam.   Bring a copy of your living will and/or healthcare power of attorney to your next office visit.  Continue doing brain stimulating activities (puzzles, reading, adult coloring books, staying active) to keep memory sharp.   Health Maintenance, Female Adopting a healthy lifestyle and getting preventive care can go a long way to promote health and wellness. Talk with your health care provider about what schedule of regular examinations is right for you. This is a good chance for you to check in with your provider about disease prevention and staying healthy. In between checkups, there are plenty of things you can do on your own. Experts have done a lot of research about which lifestyle changes and preventive measures are most likely to keep you healthy. Ask your health care provider for more information. Weight and diet Eat a healthy diet  Be sure to include plenty of vegetables, fruits, low-fat dairy products, and lean protein.  Do not eat a lot of foods high in solid fats, added sugars, or salt.  Get regular exercise. This is one of the most important things you can do for your health. ? Most adults should exercise for at least 150 minutes each week. The exercise should increase your heart rate and make you sweat (moderate-intensity exercise). ? Most adults should also do strengthening exercises at least twice a week. This is in addition to the moderate-intensity exercise.  Maintain a healthy weight  Body mass index (BMI) is a measurement that can be used to identify possible weight problems. It estimates body fat based on height and weight. Your health care provider can help  determine your BMI and help you achieve or maintain a healthy weight.  For females 41 years of age and older: ? A BMI below 18.5 is considered underweight. ? A BMI of 18.5 to 24.9 is normal. ? A BMI of 25 to 29.9 is considered overweight. ? A BMI of 30 and above is considered obese.  Watch levels of cholesterol and blood lipids  You should start having your blood tested for lipids and cholesterol at 67 years of age, then have this test every 5 years.  You may need to have your cholesterol levels checked more often if: ? Your lipid or cholesterol levels are high. ? You are older than 67 years of age. ? You are at high risk for heart disease.  Cancer screening Lung Cancer  Lung cancer screening is recommended for adults 48-73 years old who are at high risk for lung cancer because of a history of smoking.  A yearly low-dose CT scan of the lungs is recommended for people who: ? Currently smoke. ? Have quit within the past 15 years. ? Have at least a 30-pack-year history of smoking. A pack year is smoking an average of one pack of cigarettes a day for 1 year.  Yearly screening should continue until it has been 15 years since you quit.  Yearly screening should stop if you develop a health problem that would prevent you from having lung cancer treatment.  Breast Cancer  Practice breast self-awareness. This means understanding how your breasts normally appear and feel.  It also means doing regular breast  self-exams. Let your health care provider know about any changes, no matter how small.  If you are in your 20s or 30s, you should have a clinical breast exam (CBE) by a health care provider every 1-3 years as part of a regular health exam.  If you are 40 or older, have a CBE every year. Also consider having a breast X-ray (mammogram) every year.  If you have a family history of breast cancer, talk to your health care provider about genetic screening.  If you are at high risk for  breast cancer, talk to your health care provider about having an MRI and a mammogram every year.  Breast cancer gene (BRCA) assessment is recommended for women who have family members with BRCA-related cancers. BRCA-related cancers include: ? Breast. ? Ovarian. ? Tubal. ? Peritoneal cancers.  Results of the assessment will determine the need for genetic counseling and BRCA1 and BRCA2 testing.  Cervical Cancer Your health care provider may recommend that you be screened regularly for cancer of the pelvic organs (ovaries, uterus, and vagina). This screening involves a pelvic examination, including checking for microscopic changes to the surface of your cervix (Pap test). You may be encouraged to have this screening done every 3 years, beginning at age 21.  For women ages 30-65, health care providers may recommend pelvic exams and Pap testing every 3 years, or they may recommend the Pap and pelvic exam, combined with testing for human papilloma virus (HPV), every 5 years. Some types of HPV increase your risk of cervical cancer. Testing for HPV may also be done on women of any age with unclear Pap test results.  Other health care providers may not recommend any screening for nonpregnant women who are considered low risk for pelvic cancer and who do not have symptoms. Ask your health care provider if a screening pelvic exam is right for you.  If you have had past treatment for cervical cancer or a condition that could lead to cancer, you need Pap tests and screening for cancer for at least 20 years after your treatment. If Pap tests have been discontinued, your risk factors (such as having a new sexual partner) need to be reassessed to determine if screening should resume. Some women have medical problems that increase the chance of getting cervical cancer. In these cases, your health care provider may recommend more frequent screening and Pap tests.  Colorectal Cancer  This type of cancer can be  detected and often prevented.  Routine colorectal cancer screening usually begins at 67 years of age and continues through 67 years of age.  Your health care provider may recommend screening at an earlier age if you have risk factors for colon cancer.  Your health care provider may also recommend using home test kits to check for hidden blood in the stool.  A small camera at the end of a tube can be used to examine your colon directly (sigmoidoscopy or colonoscopy). This is done to check for the earliest forms of colorectal cancer.  Routine screening usually begins at age 50.  Direct examination of the colon should be repeated every 5-10 years through 67 years of age. However, you may need to be screened more often if early forms of precancerous polyps or small growths are found.  Skin Cancer  Check your skin from head to toe regularly.  Tell your health care provider about any new moles or changes in moles, especially if there is a change in a mole's shape or   color.  Also tell your health care provider if you have a mole that is larger than the size of a pencil eraser.  Always use sunscreen. Apply sunscreen liberally and repeatedly throughout the day.  Protect yourself by wearing long sleeves, pants, a wide-brimmed hat, and sunglasses whenever you are outside.  Heart disease, diabetes, and high blood pressure  High blood pressure causes heart disease and increases the risk of stroke. High blood pressure is more likely to develop in: ? People who have blood pressure in the high end of the normal range (130-139/85-89 mm Hg). ? People who are overweight or obese. ? People who are African American.  If you are 18-39 years of age, have your blood pressure checked every 3-5 years. If you are 40 years of age or older, have your blood pressure checked every year. You should have your blood pressure measured twice-once when you are at a hospital or clinic, and once when you are not at a  hospital or clinic. Record the average of the two measurements. To check your blood pressure when you are not at a hospital or clinic, you can use: ? An automated blood pressure machine at a pharmacy. ? A home blood pressure monitor.  If you are between 55 years and 79 years old, ask your health care provider if you should take aspirin to prevent strokes.  Have regular diabetes screenings. This involves taking a blood sample to check your fasting blood sugar level. ? If you are at a normal weight and have a low risk for diabetes, have this test once every three years after 67 years of age. ? If you are overweight and have a high risk for diabetes, consider being tested at a younger age or more often. Preventing infection Hepatitis B  If you have a higher risk for hepatitis B, you should be screened for this virus. You are considered at high risk for hepatitis B if: ? You were born in a country where hepatitis B is common. Ask your health care provider which countries are considered high risk. ? Your parents were born in a high-risk country, and you have not been immunized against hepatitis B (hepatitis B vaccine). ? You have HIV or AIDS. ? You use needles to inject street drugs. ? You live with someone who has hepatitis B. ? You have had sex with someone who has hepatitis B. ? You get hemodialysis treatment. ? You take certain medicines for conditions, including cancer, organ transplantation, and autoimmune conditions.  Hepatitis C  Blood testing is recommended for: ? Everyone born from 1945 through 1965. ? Anyone with known risk factors for hepatitis C.  Sexually transmitted infections (STIs)  You should be screened for sexually transmitted infections (STIs) including gonorrhea and chlamydia if: ? You are sexually active and are younger than 67 years of age. ? You are older than 67 years of age and your health care provider tells you that you are at risk for this type of  infection. ? Your sexual activity has changed since you were last screened and you are at an increased risk for chlamydia or gonorrhea. Ask your health care provider if you are at risk.  If you do not have HIV, but are at risk, it may be recommended that you take a prescription medicine daily to prevent HIV infection. This is called pre-exposure prophylaxis (PrEP). You are considered at risk if: ? You are sexually active and do not regularly use condoms or know the HIV   status of your partner(s). ? You take drugs by injection. ? You are sexually active with a partner who has HIV.  Talk with your health care provider about whether you are at high risk of being infected with HIV. If you choose to begin PrEP, you should first be tested for HIV. You should then be tested every 3 months for as long as you are taking PrEP. Pregnancy  If you are premenopausal and you may become pregnant, ask your health care provider about preconception counseling.  If you may become pregnant, take 400 to 800 micrograms (mcg) of folic acid every day.  If you want to prevent pregnancy, talk to your health care provider about birth control (contraception). Osteoporosis and menopause  Osteoporosis is a disease in which the bones lose minerals and strength with aging. This can result in serious bone fractures. Your risk for osteoporosis can be identified using a bone density scan.  If you are 75 years of age or older, or if you are at risk for osteoporosis and fractures, ask your health care provider if you should be screened.  Ask your health care provider whether you should take a calcium or vitamin D supplement to lower your risk for osteoporosis.  Menopause may have certain physical symptoms and risks.  Hormone replacement therapy may reduce some of these symptoms and risks. Talk to your health care provider about whether hormone replacement therapy is right for you. Follow these instructions at home:  Schedule  regular health, dental, and eye exams.  Stay current with your immunizations.  Do not use any tobacco products including cigarettes, chewing tobacco, or electronic cigarettes.  If you are pregnant, do not drink alcohol.  If you are breastfeeding, limit how much and how often you drink alcohol.  Limit alcohol intake to no more than 1 drink per day for nonpregnant women. One drink equals 12 ounces of beer, 5 ounces of wine, or 1 ounces of hard liquor.  Do not use street drugs.  Do not share needles.  Ask your health care provider for help if you need support or information about quitting drugs.  Tell your health care provider if you often feel depressed.  Tell your health care provider if you have ever been abused or do not feel safe at home. This information is not intended to replace advice given to you by your health care provider. Make sure you discuss any questions you have with your health care provider. Document Released: 03/18/2011 Document Revised: 02/08/2016 Document Reviewed: 06/06/2015 Elsevier Interactive Patient Education  [employer].

## 2017-08-27 ENCOUNTER — Other Ambulatory Visit: Payer: Self-pay

## 2017-09-03 ENCOUNTER — Encounter: Payer: Self-pay | Admitting: Family Medicine

## 2017-09-03 ENCOUNTER — Ambulatory Visit (INDEPENDENT_AMBULATORY_CARE_PROVIDER_SITE_OTHER): Payer: Medicare Other

## 2017-09-03 ENCOUNTER — Other Ambulatory Visit (INDEPENDENT_AMBULATORY_CARE_PROVIDER_SITE_OTHER): Payer: Medicare Other

## 2017-09-03 DIAGNOSIS — R7301 Impaired fasting glucose: Secondary | ICD-10-CM

## 2017-09-03 DIAGNOSIS — I10 Essential (primary) hypertension: Secondary | ICD-10-CM | POA: Diagnosis not present

## 2017-09-03 DIAGNOSIS — M85852 Other specified disorders of bone density and structure, left thigh: Secondary | ICD-10-CM | POA: Diagnosis not present

## 2017-09-03 DIAGNOSIS — E2839 Other primary ovarian failure: Secondary | ICD-10-CM

## 2017-09-03 DIAGNOSIS — Z780 Asymptomatic menopausal state: Secondary | ICD-10-CM | POA: Diagnosis not present

## 2017-09-03 HISTORY — PX: OTHER SURGICAL HISTORY: SHX169

## 2017-09-03 LAB — COMPREHENSIVE METABOLIC PANEL
ALK PHOS: 70 U/L (ref 39–117)
ALT: 23 U/L (ref 0–35)
AST: 18 U/L (ref 0–37)
Albumin: 4.4 g/dL (ref 3.5–5.2)
BILIRUBIN TOTAL: 1.1 mg/dL (ref 0.2–1.2)
BUN: 18 mg/dL (ref 6–23)
CALCIUM: 9.5 mg/dL (ref 8.4–10.5)
CO2: 28 mEq/L (ref 19–32)
Chloride: 103 mEq/L (ref 96–112)
Creatinine, Ser: 1.02 mg/dL (ref 0.40–1.20)
GFR: 57.42 mL/min — AB (ref >60.00)
GLUCOSE: 85 mg/dL (ref 70–99)
POTASSIUM: 4.4 meq/L (ref 3.5–5.1)
Sodium: 136 mEq/L (ref 135–145)
TOTAL PROTEIN: 7.1 g/dL (ref 6.0–8.3)

## 2017-09-03 LAB — LIPID PANEL
CHOLESTEROL: 134 mg/dL (ref 0–200)
HDL: 36.5 mg/dL — AB (ref >39.00)
NONHDL: 97.95
TRIGLYCERIDES: 211 mg/dL — AB (ref 0.0–149.0)
Total CHOL/HDL Ratio: 4
VLDL: 42.2 mg/dL — ABNORMAL HIGH (ref 0.0–40.0)

## 2017-09-03 LAB — HEMOGLOBIN A1C: Hgb A1c MFr Bld: 6 % (ref 4.6–6.5)

## 2017-09-03 LAB — LDL CHOLESTEROL, DIRECT: Direct LDL: 89 mg/dL

## 2017-09-04 ENCOUNTER — Encounter: Payer: Self-pay | Admitting: *Deleted

## 2017-09-19 ENCOUNTER — Other Ambulatory Visit: Payer: Self-pay | Admitting: Family Medicine

## 2017-09-19 DIAGNOSIS — Z1231 Encounter for screening mammogram for malignant neoplasm of breast: Secondary | ICD-10-CM

## 2017-11-05 ENCOUNTER — Ambulatory Visit (INDEPENDENT_AMBULATORY_CARE_PROVIDER_SITE_OTHER): Payer: Medicare Other

## 2017-11-05 DIAGNOSIS — Z1231 Encounter for screening mammogram for malignant neoplasm of breast: Secondary | ICD-10-CM

## 2017-11-14 DIAGNOSIS — K8020 Calculus of gallbladder without cholecystitis without obstruction: Secondary | ICD-10-CM

## 2017-11-14 HISTORY — DX: Calculus of gallbladder without cholecystitis without obstruction: K80.20

## 2017-11-26 DIAGNOSIS — H04123 Dry eye syndrome of bilateral lacrimal glands: Secondary | ICD-10-CM | POA: Diagnosis not present

## 2017-11-26 DIAGNOSIS — H5213 Myopia, bilateral: Secondary | ICD-10-CM | POA: Diagnosis not present

## 2017-11-26 DIAGNOSIS — H524 Presbyopia: Secondary | ICD-10-CM | POA: Diagnosis not present

## 2017-11-26 DIAGNOSIS — Z961 Presence of intraocular lens: Secondary | ICD-10-CM | POA: Diagnosis not present

## 2017-11-26 DIAGNOSIS — H43813 Vitreous degeneration, bilateral: Secondary | ICD-10-CM | POA: Diagnosis not present

## 2017-11-26 DIAGNOSIS — H25012 Cortical age-related cataract, left eye: Secondary | ICD-10-CM | POA: Diagnosis not present

## 2017-11-26 DIAGNOSIS — H52223 Regular astigmatism, bilateral: Secondary | ICD-10-CM | POA: Diagnosis not present

## 2017-12-05 ENCOUNTER — Telehealth: Payer: Self-pay | Admitting: Family Medicine

## 2017-12-05 ENCOUNTER — Ambulatory Visit (INDEPENDENT_AMBULATORY_CARE_PROVIDER_SITE_OTHER): Payer: Medicare Other | Admitting: Family Medicine

## 2017-12-05 ENCOUNTER — Encounter: Payer: Self-pay | Admitting: Family Medicine

## 2017-12-05 VITALS — BP 153/77 | HR 82 | Temp 99.0°F | Resp 16 | Ht 62.0 in | Wt 165.1 lb

## 2017-12-05 DIAGNOSIS — R1030 Lower abdominal pain, unspecified: Secondary | ICD-10-CM

## 2017-12-05 DIAGNOSIS — R1010 Upper abdominal pain, unspecified: Secondary | ICD-10-CM | POA: Diagnosis not present

## 2017-12-05 DIAGNOSIS — K5900 Constipation, unspecified: Secondary | ICD-10-CM | POA: Diagnosis not present

## 2017-12-05 DIAGNOSIS — R140 Abdominal distension (gaseous): Secondary | ICD-10-CM | POA: Diagnosis not present

## 2017-12-05 LAB — SEDIMENTATION RATE: Sed Rate: 8 mm/hr (ref 0–30)

## 2017-12-05 LAB — CBC WITH DIFFERENTIAL/PLATELET
BASOS PCT: 0.6 % (ref 0.0–3.0)
Basophils Absolute: 0 10*3/uL (ref 0.0–0.1)
EOS PCT: 2.4 % (ref 0.0–5.0)
Eosinophils Absolute: 0.2 10*3/uL (ref 0.0–0.7)
HCT: 44.1 % (ref 36.0–46.0)
Hemoglobin: 15 g/dL (ref 12.0–15.0)
LYMPHS ABS: 3 10*3/uL (ref 0.7–4.0)
Lymphocytes Relative: 45.5 % (ref 12.0–46.0)
MCHC: 33.9 g/dL (ref 30.0–36.0)
MCV: 87.4 fl (ref 78.0–100.0)
MONOS PCT: 11.1 % (ref 3.0–12.0)
Monocytes Absolute: 0.7 10*3/uL (ref 0.1–1.0)
Neutro Abs: 2.7 10*3/uL (ref 1.4–7.7)
Neutrophils Relative %: 40.4 % — ABNORMAL LOW (ref 43.0–77.0)
Platelets: 360 10*3/uL (ref 150.0–400.0)
RBC: 5.05 Mil/uL (ref 3.87–5.11)
RDW: 14.1 % (ref 11.5–15.5)
WBC: 6.6 10*3/uL (ref 4.0–10.5)

## 2017-12-05 LAB — COMPREHENSIVE METABOLIC PANEL
ALT: 27 U/L (ref 0–35)
AST: 20 U/L (ref 0–37)
Albumin: 4.5 g/dL (ref 3.5–5.2)
Alkaline Phosphatase: 77 U/L (ref 39–117)
BUN: 16 mg/dL (ref 6–23)
CALCIUM: 10.2 mg/dL (ref 8.4–10.5)
CHLORIDE: 104 meq/L (ref 96–112)
CO2: 26 meq/L (ref 19–32)
CREATININE: 0.97 mg/dL (ref 0.40–1.20)
GFR: 60.8 mL/min (ref >60.00)
Glucose, Bld: 77 mg/dL (ref 70–99)
POTASSIUM: 4.6 meq/L (ref 3.5–5.1)
Sodium: 140 mEq/L (ref 135–145)
Total Bilirubin: 0.7 mg/dL (ref 0.2–1.2)
Total Protein: 7.4 g/dL (ref 6.0–8.3)

## 2017-12-05 LAB — LIPASE: LIPASE: 43 U/L (ref 11.0–59.0)

## 2017-12-05 LAB — H. PYLORI ANTIBODY, IGG: H Pylori IgG: NEGATIVE

## 2017-12-05 NOTE — Progress Notes (Signed)
OFFICE VISIT  12/05/2017   CC:  Chief Complaint  Patient presents with  . Abdominal Pain    upper and lower  . Back Pain    lower     HPI:    Patient is a 67 y.o. Caucasian female who presents for abdominal pain. Onset 1 mo ago, upper abd ache in middle, occ radiates into her back. Also with bilat groin region pain, "like menstrual cramping and it goes into low back region sometimes". Heating pad helps with lower abd/groin pains. She also has had intermittent problem over this time with having alternating constip with some days of multiple smaller loose/soft BMs that are not frank diarrhea.  No blood in stool.  No pus in stools.  No fevers.  No vag bleeding or d/c. Some mild nausea off and on during this time.  No vomiting. Taking meloxicam but not regularly.   Occ/sporadically taking a ASA/caff "back and body" pill (approx every 2 wks).  No travel or ingestion of potentially abnl water and no contacts who have similar sx's.  No dysuria.  NO persistent urinary urgency or urinary frequency.  Past Medical History:  Diagnosis Date  . Allergic rhinitis   . Anxiety   . Chronic renal insufficiency, stage 2 (mild) 2018   Stage II/III: GFR high 50s-low 60s  . Diverticulosis   . GERD (gastroesophageal reflux disease)   . History of chest pain    Normal ETT 2012  . Hypertension   . IFG (impaired fasting glucose) 01/2017   Hb A1c 6%  . Lipoma of neck    R post cerv area  . Posterior vitreous detachment of right eye 2017   Dr. Larose Kells  . Right knee DJD 01/2014   per old records, x-ray showed mild tricompartmental DJD with large joint effusion.  . Seasonal allergies   . Trigger middle finger of left hand 01/2016   Dr. Amedeo Plenty aspirated/injected this    Past Surgical History:  Procedure Laterality Date  . CATARACT EXTRACTION W/ INTRAOCULAR LENS IMPLANT Right 09/17/2015  . [place]  . COLONOSCOPY  11/07/05; 11/2015   2017 tubular adenoma: recall 5 yrs  .  DEXA  08/05/11   Normal  . DEXA  09/03/2017   T score -1.5.  Rpt 2 yrs.  Marland Kitchen ETT  07/12/11   Normal (at [facility]) per old records  . EYE MUSCLE SURGERY Bilateral 1965  . PILONIDAL CYST EXCISION  1975    Outpatient Medications Prior to Visit  Medication Sig Dispense Refill  . Aspirin-Caffeine (BACK & BODY EXTRA STRENGTH PO) Take by mouth. Reported on 01/22/2016    . Calcium Carb-Cholecalciferol (CALCIUM 600+D) 600-800 MG-UNIT TABS Take by mouth.    . calcium carbonate (TUMS - DOSED IN MG ELEMENTAL CALCIUM) 500 MG chewable tablet Chew 1 tablet by mouth daily.    . irbesartan (AVAPRO) 75 MG tablet TAKE ONE TABLET BY MOUTH EVERY DAY 90 tablet 1  . meloxicam (MOBIC) 15 MG tablet 1 tab po qd prn joint aches/stiffness 30 tablet 3  . Multiple Vitamins-Minerals (WOMENS 50+ MULTI VITAMIN/MIN PO) Take 1 tablet by mouth daily. Reported on 01/22/2016    . pantoprazole (PROTONIX) 40 MG tablet Take 1 tablet (40 mg total) by mouth daily. 90 tablet 3   No facility-administered medications prior to visit.     Allergies  Allergen Reactions  . Tetracyclines & Related Rash    ROS As per HPI  PE: Blood pressure (!) 153/77, pulse 82, temperature 99  F (37.2 C), temperature source Temporal, resp. rate 16, height '5\' 2"'  (1.575 m), weight 165 lb 2 oz (74.9 kg), SpO2 100 %.  Pt examined with Helayne Seminole, CMA, as chaperone.  Gen: Alert, well appearing.  Patient is oriented to person, place, time, and situation. AFFECT: pleasant, lucid thought and speech. UGQ:BVQX: no injection, icteris, swelling, or exudate.  EOMI, PERRLA. Mouth: lips without lesion/swelling.  Oral mucosa pink and moist. Oropharynx without erythema, exudate, or swelling.  CV: RRR, no m/r/g.   LUNGS: CTA bilat, nonlabored resps, good aeration in all lung fields. ABD: soft, mild mid-epigastric TTP w/out guarding or rebound tenderness, ND, BS normal.  No hepatospenomegaly or mass.  No bruits.  No lower abd/groin TTP.   LABS:  Lab Results   Component Value Date   WBC 5.6 02/28/2015   HGB 15.8 (H) 02/28/2015   HCT 47.4 (H) 02/28/2015   MCV 88.0 02/28/2015   PLT 336.0 02/28/2015     Chemistry      Component Value Date/Time   NA 136 09/03/2017 0858   K 4.4 09/03/2017 0858   CL 103 09/03/2017 0858   CO2 28 09/03/2017 0858   BUN 18 09/03/2017 0858   CREATININE 1.02 09/03/2017 0858      Component Value Date/Time   CALCIUM 9.5 09/03/2017 0858   ALKPHOS 70 09/03/2017 0858   AST 18 09/03/2017 0858   ALT 23 09/03/2017 0858   BILITOT 1.1 09/03/2017 0858       IMPRESSION AND PLAN:  1) Abdominal pains: she has some epigastric pain c/w dyspepsia vs gastritis. Recommended she increase pantoprazole to 82m BID. She also has some more functional -type GI sx's---bloating/gas/lower abd "cramping", stool irregularities. Stress and sometimes eating makes this worse.   Plan: check UA, CBC w/diff, CMET, ESR, H pylori Ab, lipase. Check a KUB for stool burden assessment. Consider stool testing for giardia if sx's persist/worsen.  An After Visit Summary was printed and given to the patient.  FOLLOW UP: Return in about 3 weeks (around 12/26/2017) for f/u abd pain/constip-?IBS.  Signed:  PCrissie Sickles MD           12/05/2017

## 2017-12-05 NOTE — Telephone Encounter (Signed)
Returned call [facility], she will call one day next week and come in for UA.

## 2017-12-05 NOTE — Patient Instructions (Signed)
Increase your pantoprazole to 1 tab every morning and 1 tab every evening.

## 2017-12-05 NOTE — Telephone Encounter (Signed)
Copied from [employer]. Topic: Quick Communication - See Telephone Encounter >> Dec 05, 2017  4:23 PM Kelly Barnett, NT wrote: CRM for notification. See Telephone encounter for: 12/05/17.  Patient was seen today and states she did blood work but forgot to do the urine test. She would like to know if she needs to come back and do that. [phone]

## 2017-12-08 ENCOUNTER — Encounter: Payer: Self-pay | Admitting: *Deleted

## 2017-12-09 ENCOUNTER — Other Ambulatory Visit: Payer: Self-pay

## 2017-12-09 ENCOUNTER — Ambulatory Visit (INDEPENDENT_AMBULATORY_CARE_PROVIDER_SITE_OTHER): Payer: Medicare Other

## 2017-12-09 DIAGNOSIS — R1030 Lower abdominal pain, unspecified: Secondary | ICD-10-CM

## 2017-12-09 DIAGNOSIS — R1010 Upper abdominal pain, unspecified: Secondary | ICD-10-CM

## 2017-12-09 DIAGNOSIS — R140 Abdominal distension (gaseous): Secondary | ICD-10-CM

## 2017-12-09 DIAGNOSIS — K5900 Constipation, unspecified: Secondary | ICD-10-CM

## 2017-12-09 DIAGNOSIS — K8020 Calculus of gallbladder without cholecystitis without obstruction: Secondary | ICD-10-CM | POA: Diagnosis not present

## 2017-12-09 DIAGNOSIS — K8080 Other cholelithiasis without obstruction: Secondary | ICD-10-CM

## 2017-12-09 LAB — POC URINALSYSI DIPSTICK (AUTOMATED)
Bilirubin, UA: NEGATIVE
Blood, UA: NEGATIVE
Glucose, UA: NEGATIVE
KETONES UA: NEGATIVE
LEUKOCYTES UA: NEGATIVE
Nitrite, UA: NEGATIVE
PROTEIN UA: NEGATIVE
Spec Grav, UA: <=1.005 — AB (ref 1.010–1.025)
Urobilinogen, UA: 0.2 E.U./dL
pH, UA: 6 (ref 5.0–8.0)

## 2017-12-09 NOTE — Addendum Note (Signed)
Addended by: Ralph Dowdy on: 12/09/2017 10:42 AM   Modules accepted: Orders

## 2017-12-10 ENCOUNTER — Encounter: Payer: Self-pay | Admitting: *Deleted

## 2017-12-10 ENCOUNTER — Encounter: Payer: Self-pay | Admitting: Family Medicine

## 2017-12-25 ENCOUNTER — Ambulatory Visit (INDEPENDENT_AMBULATORY_CARE_PROVIDER_SITE_OTHER): Payer: Medicare Other | Admitting: Family Medicine

## 2017-12-25 ENCOUNTER — Encounter: Payer: Self-pay | Admitting: Family Medicine

## 2017-12-25 VITALS — BP 128/84 | HR 72 | Temp 98.1°F | Resp 16 | Ht 62.0 in | Wt 161.0 lb

## 2017-12-25 DIAGNOSIS — R1013 Epigastric pain: Secondary | ICD-10-CM

## 2017-12-25 DIAGNOSIS — K8020 Calculus of gallbladder without cholecystitis without obstruction: Secondary | ICD-10-CM | POA: Diagnosis not present

## 2017-12-25 DIAGNOSIS — R109 Unspecified abdominal pain: Secondary | ICD-10-CM | POA: Diagnosis not present

## 2017-12-25 DIAGNOSIS — K5909 Other constipation: Secondary | ICD-10-CM | POA: Diagnosis not present

## 2017-12-25 MED ORDER — HYDROCORTISONE 2.5 % RE CREA: 1.0000 "application " | TOPICAL_CREAM | Freq: Two times a day (BID) | RECTAL | 1 refills | Status: AC

## 2017-12-25 NOTE — Progress Notes (Signed)
OFFICE VISIT  12/25/2017   CC:  Chief Complaint  Patient presents with  . Follow-up   HPI:    Patient is a 67 y.o. Caucasian female who presents for 3 week f/u GI complaints that I felt were likely some dyspepsia and possibly IBS. Lab w/u normal last visit (see labs below). I recommended she increase her PPI to bid. KUB showed moderate stool burden---I recommended miralax 1 capful bid until she got a couple of good evacuations.  Gallstones were also picked up on the KUB.  Pt with lots of questions about recent labs and imaging.  Lots of reassurance needed that all labs normal, and there is no sign that the gallstones picked up on KUB are causing any of her current problems. No longer in pain in upper abdomen at all. Still some mild lower abd soreness, but she did also have many BMs from taking the miralax, but not large and she didn't feel "empty".  She took the miralax x 7d then stopped.  No diarrhea. She does describe a new hemorrhoid she can feel and it has some bright red blood associated with it when she wipes.  It does feel sore.  Past Medical History:  Diagnosis Date  . Allergic rhinitis   . Anxiety   . Chronic renal insufficiency, stage 2 (mild) 2018   Stage II/III: GFR high 50s-low 60s  . Diverticulosis   . Gallstones 11/2017   Noted on KUB  . GERD (gastroesophageal reflux disease)   . History of chest pain    Normal ETT 2012  . Hypertension   . IFG (impaired fasting glucose) 01/2017   Hb A1c 6%  . Lipoma of neck    R post cerv area  . Posterior vitreous detachment of right eye 2017   Dr. Larose Kells  . Right knee DJD 01/2014   per old records, x-ray showed mild tricompartmental DJD with large joint effusion.  . Seasonal allergies   . Trigger middle finger of left hand 01/2016   Dr. Amedeo Plenty aspirated/injected this    Past Surgical History:  Procedure Laterality Date  . CATARACT EXTRACTION W/ INTRAOCULAR LENS IMPLANT Right 09/17/2015  . [place]   . COLONOSCOPY  11/07/05; 11/2015   2017 tubular adenoma: recall 5 yrs  . DEXA  08/05/11   Normal  . DEXA  09/03/2017   T score -1.5.  Rpt 2 yrs.  Marland Kitchen ETT  07/12/11   Normal (at [facility]) per old records  . EYE MUSCLE SURGERY Bilateral 1965  . PILONIDAL CYST EXCISION  1975    Outpatient Medications Prior to Visit  Medication Sig Dispense Refill  . Calcium Carb-Cholecalciferol (CALCIUM 600+D) 600-800 MG-UNIT TABS Take by mouth.    . irbesartan (AVAPRO) 75 MG tablet TAKE ONE TABLET BY MOUTH EVERY DAY 90 tablet 1  . meloxicam (MOBIC) 15 MG tablet 1 tab po qd prn joint aches/stiffness 30 tablet 3  . Multiple Vitamins-Minerals (WOMENS 50+ MULTI VITAMIN/MIN PO) Take 1 tablet by mouth daily. Reported on 01/22/2016    . pantoprazole (PROTONIX) 40 MG tablet Take 1 tablet (40 mg total) by mouth daily. 90 tablet 3  . Aspirin-Caffeine (BACK & BODY EXTRA STRENGTH PO) Take by mouth. Reported on 01/22/2016    . calcium carbonate (TUMS - DOSED IN MG ELEMENTAL CALCIUM) 500 MG chewable tablet Chew 1 tablet by mouth daily.     No facility-administered medications prior to visit.     Allergies  Allergen Reactions  . Tetracyclines &  Related Rash    ROS As per HPI  PE: Blood pressure 128/84, pulse 72, temperature 98.1 F (36.7 C), temperature source Oral, resp. rate 16, height 5\' 2"  (1.575 m), weight 161 lb (73 kg), SpO2 99 %. Gen: Alert, well appearing.  Patient is oriented to person, place, time, and situation. AFFECT: pleasant, lucid thought and speech. No further exam today.  LABS:  Lab Results  Component Value Date   WBC 6.6 12/05/2017   HGB 15.0 12/05/2017   HCT 44.1 12/05/2017   MCV 87.4 12/05/2017   PLT 360.0 12/05/2017     Chemistry      Component Value Date/Time   NA 140 12/05/2017 1341   K 4.6 12/05/2017 1341   CL 104 12/05/2017 1341   CO2 26 12/05/2017 1341   BUN 16 12/05/2017 1341   CREATININE 0.97 12/05/2017 1341      Component Value Date/Time   CALCIUM 10.2 12/05/2017 1341    ALKPHOS 77 12/05/2017 1341   AST 20 12/05/2017 1341   ALT 27 12/05/2017 1341   BILITOT 0.7 12/05/2017 1341     Lab Results  Component Value Date   LIPASE 43.0 12/05/2017   Lab Results  Component Value Date   ESRSEDRATE 8 12/05/2017   UA normal 12/05/17  H pylori IgG neg 12/05/17.  IMPRESSION AND PLAN:  1) Dyspepsia: resolved with bid PPI.  Continue PPI bid for 7 more days, then resume once daily dosing. Minimize NSAID use.  2) Functional abd pain/IBS suspected; constipation predominant. Continue miralax qd-bid until she has a feeling of complete colonic emptiness. Then change to miralax qd - bid prn. Reassuring lab w/u at last visit.  3) Gallstones: detected on plain film.  She has no symptoms suggestive of symptomatic cholelithiasis or cholecystitis. Signs/symptoms to call or return for were reviewed and pt expressed understanding.  Spent 25 min with pt today, with >50% of this time spent in counseling and care coordination regarding the above problems.  An After Visit Summary was printed and given to the patient.  FOLLOW UP: Return if symptoms worsen or fail to improve.  Signed:  Crissie Sickles, MD           12/25/2017

## 2017-12-25 NOTE — Patient Instructions (Signed)
Take your pantoprazole twice a day for 7 more days, then go back to taking it once daily.  Take miralax 1 capful once daily until you feel like you have emptied your colon. Then continue to use this medication to help constipation on an "as needed" basis.

## 2018-02-02 ENCOUNTER — Ambulatory Visit: Payer: Self-pay | Admitting: Family Medicine

## 2018-02-10 ENCOUNTER — Other Ambulatory Visit: Payer: Self-pay | Admitting: Family Medicine

## 2018-04-13 ENCOUNTER — Other Ambulatory Visit: Payer: Self-pay | Admitting: Family Medicine

## 2018-04-14 ENCOUNTER — Encounter: Payer: Self-pay | Admitting: Family Medicine

## 2018-04-14 ENCOUNTER — Ambulatory Visit (INDEPENDENT_AMBULATORY_CARE_PROVIDER_SITE_OTHER): Payer: Medicare Other | Admitting: Family Medicine

## 2018-04-14 ENCOUNTER — Encounter: Payer: Self-pay | Admitting: *Deleted

## 2018-04-14 VITALS — BP 125/87 | HR 72 | Resp 16 | Ht 62.0 in | Wt 162.0 lb

## 2018-04-14 DIAGNOSIS — R109 Unspecified abdominal pain: Secondary | ICD-10-CM | POA: Diagnosis not present

## 2018-04-14 DIAGNOSIS — I10 Essential (primary) hypertension: Secondary | ICD-10-CM

## 2018-04-14 DIAGNOSIS — Z1283 Encounter for screening for malignant neoplasm of skin: Secondary | ICD-10-CM

## 2018-04-14 DIAGNOSIS — E663 Overweight: Secondary | ICD-10-CM

## 2018-04-14 DIAGNOSIS — N182 Chronic kidney disease, stage 2 (mild): Secondary | ICD-10-CM

## 2018-04-14 DIAGNOSIS — R7303 Prediabetes: Secondary | ICD-10-CM

## 2018-04-14 LAB — BASIC METABOLIC PANEL
BUN: 16 mg/dL (ref 6–23)
CHLORIDE: 103 meq/L (ref 96–112)
CO2: 28 meq/L (ref 19–32)
Calcium: 9.8 mg/dL (ref 8.4–10.5)
Creatinine, Ser: 0.96 mg/dL (ref 0.40–1.20)
GFR: 61.47 mL/min (ref >60.00)
GLUCOSE: 98 mg/dL (ref 70–99)
POTASSIUM: 4.2 meq/L (ref 3.5–5.1)
Sodium: 138 mEq/L (ref 135–145)

## 2018-04-14 LAB — HEMOGLOBIN A1C: Hgb A1c MFr Bld: 6.1 % (ref 4.6–6.5)

## 2018-04-14 NOTE — Patient Instructions (Signed)
Add a dose of over the counter, generic Zantac 150mg  once a day as needed for mid/upper abdominal pains.

## 2018-04-14 NOTE — Progress Notes (Signed)
OFFICE VISIT  04/14/2018   CC:  Chief Complaint  Patient presents with  . Follow-up    Refill on medications   HPI:    Patient is a 67 y.o. Caucasian female who presents for f/u HTN, IFG, functional abd pain syndrome.  HTN: sporadic monitoring at home b/c she feels well.  Consistently <130/80. Anxiety increased of late, some friends with serious health problems, one who suddenly died recently. Work Research scientist (medical).  Feels overwhelmed easier as she ages.  Abdomen: still getting some mid epigastric pain intermittently, more dyspepsia lately than lower abd sx's. Taking pantoprazole daily.    Osteoarthritis: only takes meloxicam a few times a month for arthritic pains in back or knees.  ROS: no CP, no SOB, no wheezing, no cough, no dizziness, no HAs, no rashes, no melena/hematochezia.  No polyuria or polydipsia.  No myalgias.   Past Medical History:  Diagnosis Date  . Allergic rhinitis   . Anxiety   . Asymptomatic gallstones 11/2017   Noted on KUB  . Chronic renal insufficiency, stage 2 (mild) 2018   Stage II/III: GFR high 50s-low 60s  . Diverticulosis   . GERD (gastroesophageal reflux disease)   . History of chest pain    Normal ETT 2012  . Hypertension   . IFG (impaired fasting glucose) 01/2017   Hb A1c 6%  . Lipoma of neck    R post cerv area  . Posterior vitreous detachment of right eye 2017   Dr. Larose Kells  . Right knee DJD 01/2014   per old records, x-ray showed mild tricompartmental DJD with large joint effusion.  . Seasonal allergies   . Trigger middle finger of left hand 01/2016   Dr. Amedeo Plenty aspirated/injected this    Past Surgical History:  Procedure Laterality Date  . CATARACT EXTRACTION W/ INTRAOCULAR LENS IMPLANT Right 09/17/2015  . [place]  . COLONOSCOPY  11/07/05; 11/2015   2017 tubular adenoma: recall 5 yrs  . DEXA  08/05/11   Normal  . DEXA  09/03/2017   T score -1.5.  Rpt 2 yrs.  Marland Kitchen ETT  07/12/11   Normal (at [facility]) per old  records  . EYE MUSCLE SURGERY Bilateral 1965  . PILONIDAL CYST EXCISION  1975    Outpatient Medications Prior to Visit  Medication Sig Dispense Refill  . Aspirin-Caffeine (BACK & BODY EXTRA STRENGTH PO) Take by mouth. Reported on 01/22/2016    . Calcium Carb-Cholecalciferol (CALCIUM 600+D) 600-800 MG-UNIT TABS Take by mouth.    . calcium carbonate (TUMS - DOSED IN MG ELEMENTAL CALCIUM) 500 MG chewable tablet Chew 1 tablet by mouth daily.    . irbesartan (AVAPRO) 75 MG tablet Take 1 tablet (75 mg total) by mouth daily. OFFICE VISIT NEEDED 90 tablet 0  . meloxicam (MOBIC) 15 MG tablet 1 tab po qd prn joint aches/stiffness 30 tablet 3  . Multiple Vitamins-Minerals (WOMENS 50+ MULTI VITAMIN/MIN PO) Take 1 tablet by mouth daily. Reported on 01/22/2016    . pantoprazole (PROTONIX) 40 MG tablet TAKE ONE TABLET BY MOUTH EVERY DAY 90 tablet 3  . hydrocortisone (ANUSOL-HC) 2.5 % rectal cream Place 1 application rectally 2 (two) times daily. (Patient not taking: Reported on 04/14/2018) 30 g 1   No facility-administered medications prior to visit.     Allergies  Allergen Reactions  . Tetracyclines & Related Rash    ROS As per HPI  PE: Blood pressure 125/87, pulse 72, resp. rate 16, height 5\' 2"  (1.575 m), weight  162 lb (73.5 kg), SpO2 97 %. Body mass index is 29.63 kg/m.  Gen: Alert, well appearing.  Patient is oriented to person, place, time, and situation. AFFECT: pleasant, lucid thought and speech. CV: RRR, no m/r/g.   LUNGS: CTA bilat, nonlabored resps, good aeration in all lung fields. EXT: no clubbing, cyanosis, or edema.    LABS:  Lab Results  Component Value Date   TSH 0.81 01/22/2017   Lab Results  Component Value Date   WBC 6.6 12/05/2017   HGB 15.0 12/05/2017   HCT 44.1 12/05/2017   MCV 87.4 12/05/2017   PLT 360.0 12/05/2017   Lab Results  Component Value Date   CREATININE 0.97 12/05/2017   BUN 16 12/05/2017   NA 140 12/05/2017   K 4.6 12/05/2017   CL 104 12/05/2017    CO2 26 12/05/2017   Lab Results  Component Value Date   ALT 27 12/05/2017   AST 20 12/05/2017   ALKPHOS 77 12/05/2017   BILITOT 0.7 12/05/2017   Lab Results  Component Value Date   CHOL 134 09/03/2017   Lab Results  Component Value Date   HDL 36.50 (L) 09/03/2017   Lab Results  Component Value Date   LDLCALC 107 (H) 02/28/2015   Lab Results  Component Value Date   TRIG 211.0 (H) 09/03/2017   Lab Results  Component Value Date   CHOLHDL 4 09/03/2017   Lab Results  Component Value Date   HGBA1C 6.0 09/03/2017    IMPRESSION AND PLAN:  1) Functional abd pain syndrome: dyspepsia + some IBS-constipation type less commonly. Doing well for the most part on daily pantoprazole 40mg . Add H2 blocker qd prn or TUMS/ROLAIDS prn for any breakthrough dyspepsia.  2) HTN: The current medical regimen is effective;  continue present plan and medications. Lytes/cr today.  3) IFG: has been making some changes to simple carb intake and fat intake. Work is active, she walks some for exercise, no vigorous CV exercise.  Wt today same as 3 mo ago. Recheck A1c today.  4) Skin ca screening: pt requests referral to dermatologist today for skin ca screening: ordered.  An After Visit Summary was printed and given to the patient.  FOLLOW UP: Return for AWV 3 mo.  Office f/u with me 6 mo (fasting)..  Signed:  Crissie Sickles, MD           04/14/2018

## 2018-05-05 IMAGING — MG DIGITAL SCREENING BILATERAL MAMMOGRAM WITH TOMO AND CAD
8 series · 9 of 24 positions shown · non-contrast
Comparison: Previous exam(s).

CLINICAL DATA: Screening.

EXAM:
DIGITAL SCREENING BILATERAL MAMMOGRAM WITH TOMO AND CAD

[L CC synth-2D]
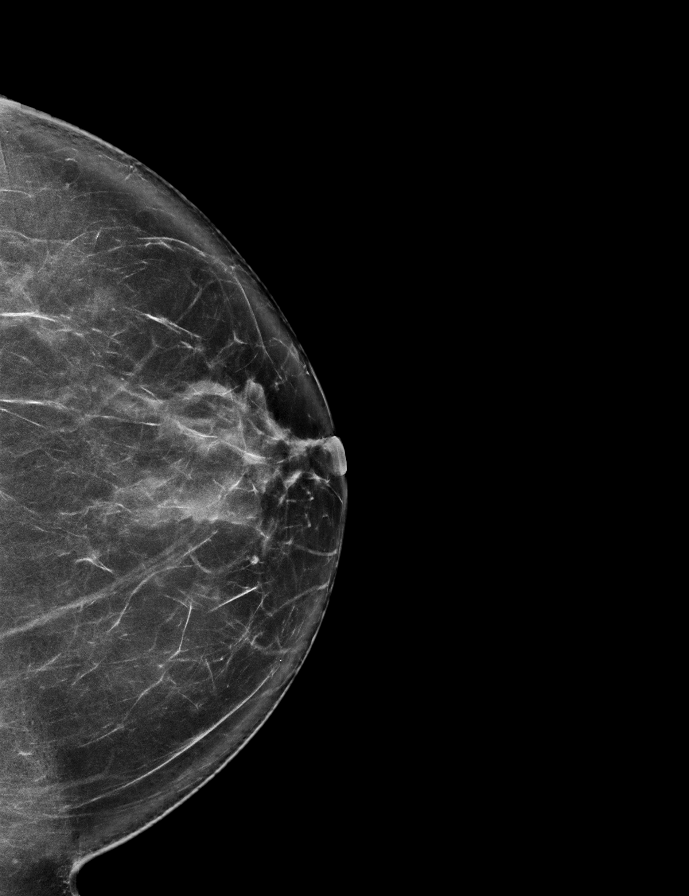

[R MLO synth-2D]
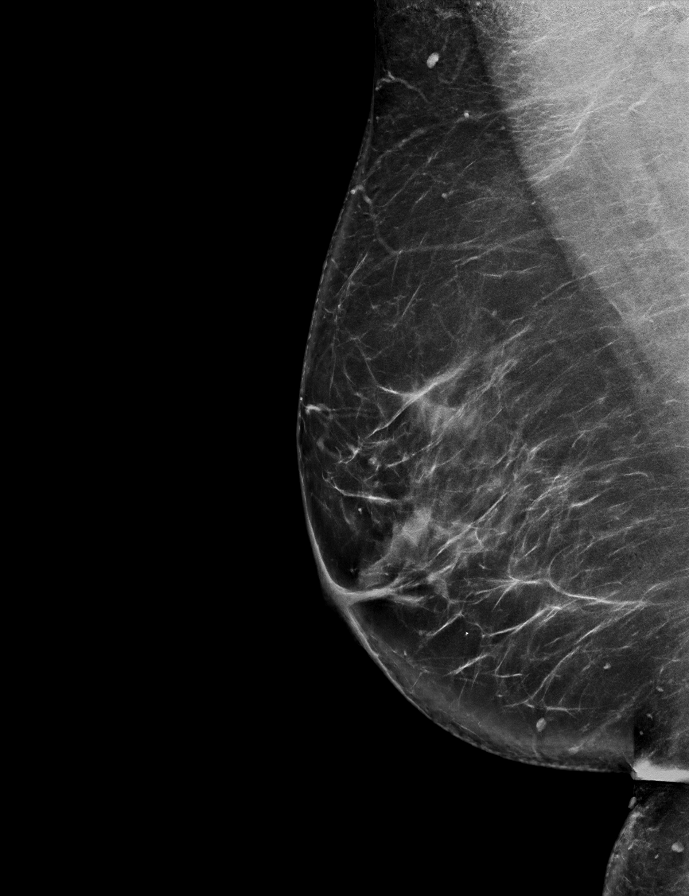

[R CC synth-2D]
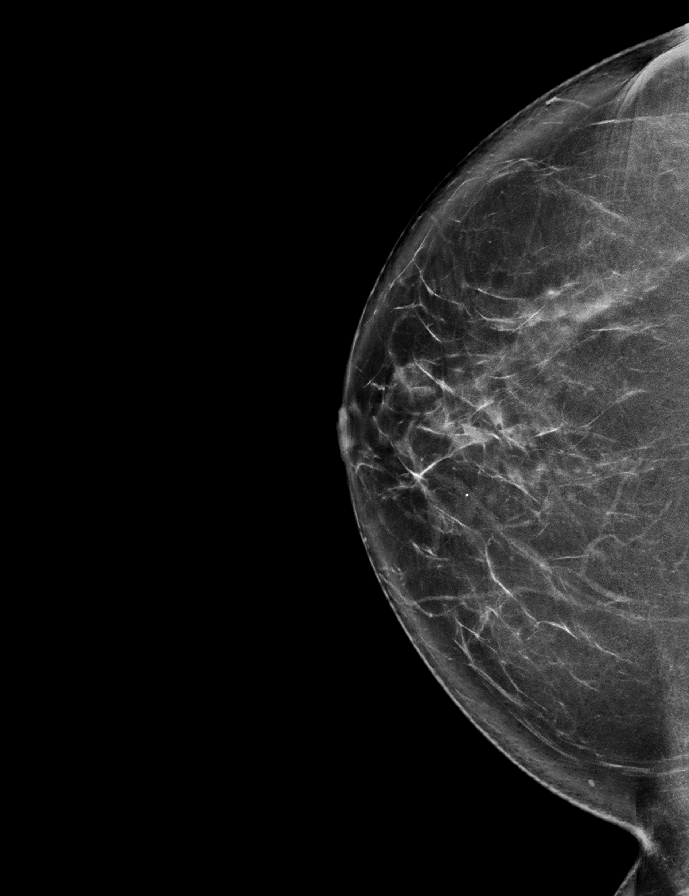

[L MLO synth-2D]
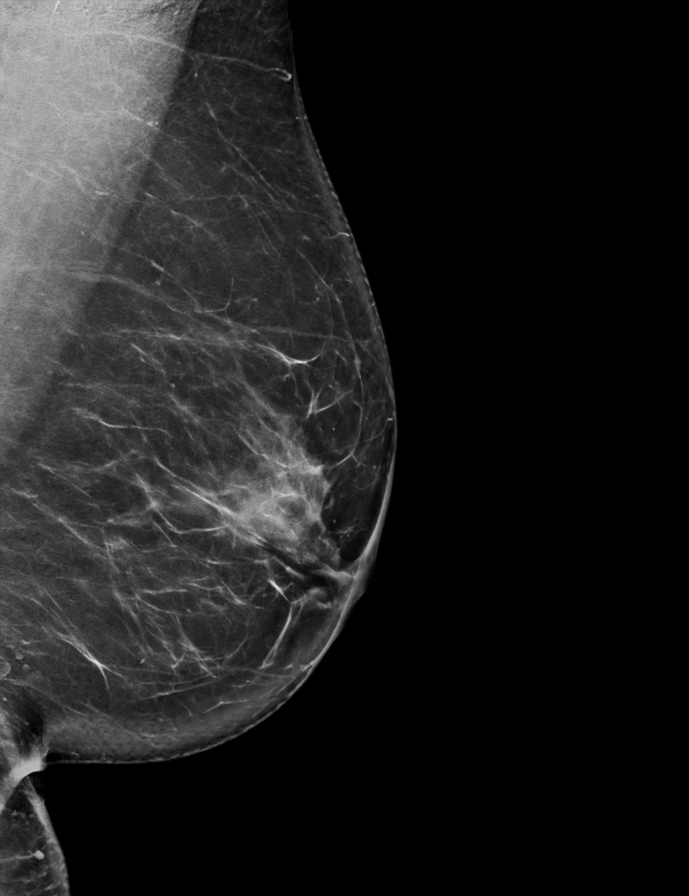

[L MLO tomo · 2 of 81 frames shown]
[frame 27/81]
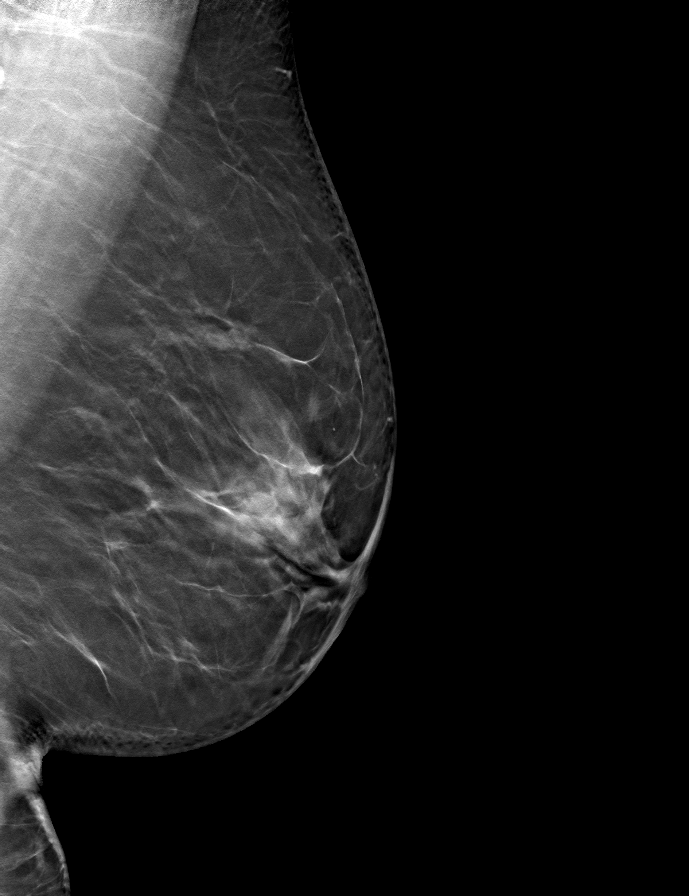
[frame 41/81]
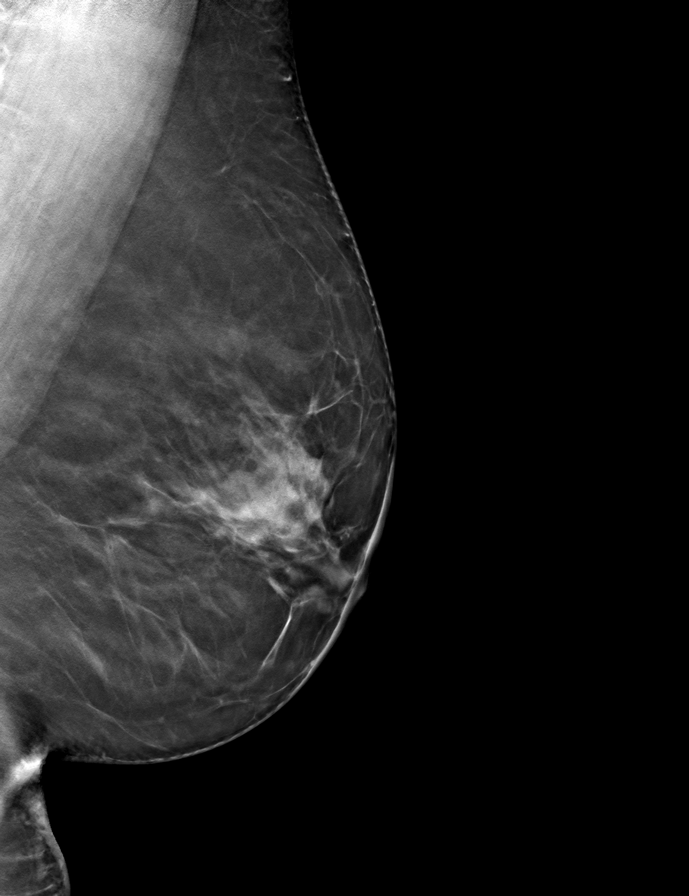

[R MLO tomo · tomo slice 43/85.0]
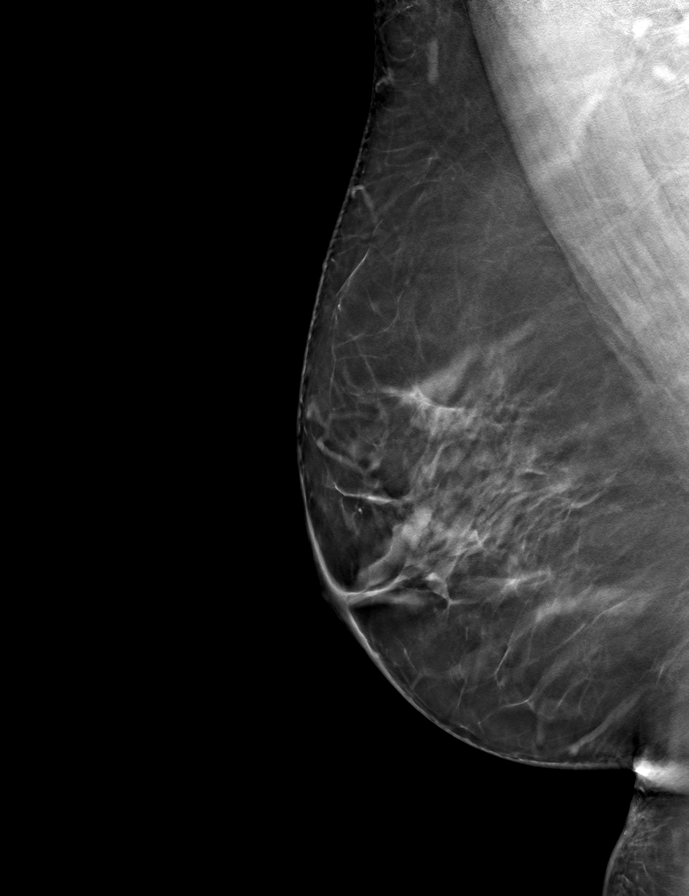

[R CC tomo · tomo slice 42/83.0]
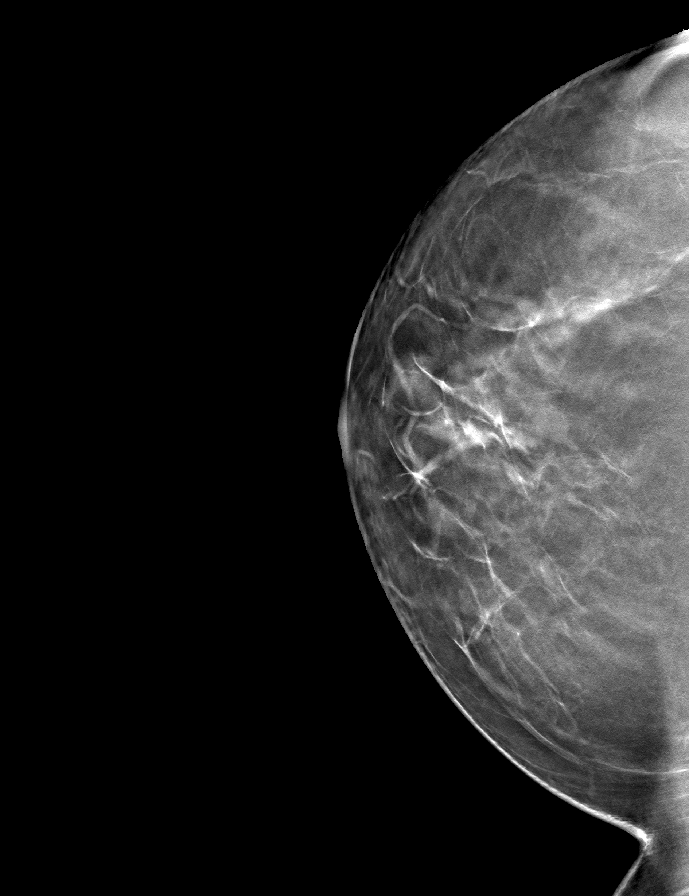

[L CC tomo · tomo slice 40/79.0]
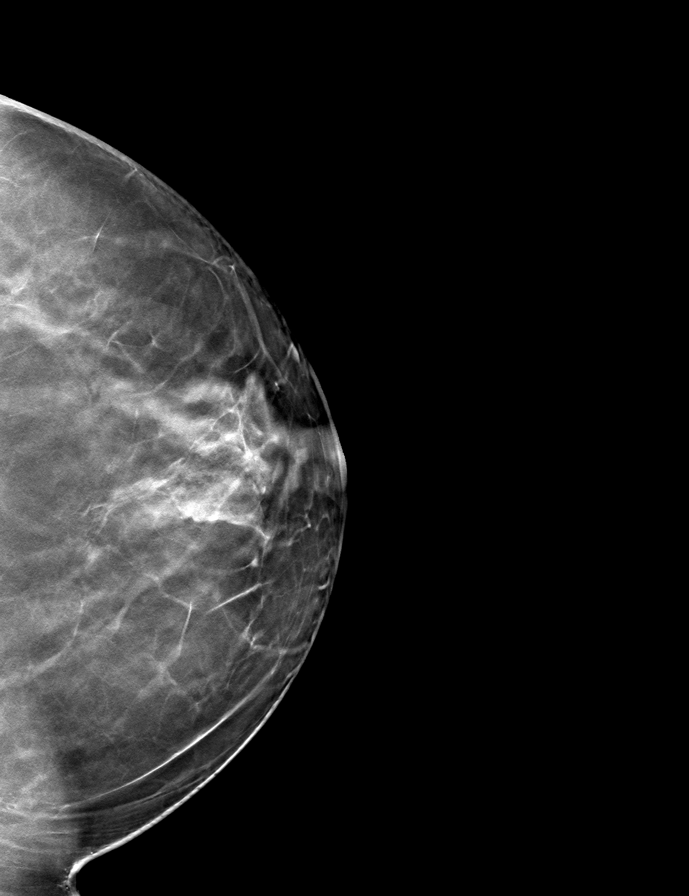

[9 of 24 positions shown; findings below may reference images not displayed]

ACR Breast Density Category b: There are scattered areas of
fibroglandular density.
FINDINGS: There are no findings suspicious for malignancy. Images were
processed with CAD.
IMPRESSION: No mammographic evidence of malignancy. A result letter of this
screening mammogram will be mailed directly to the patient.

RECOMMENDATION:
Screening mammogram in one year. (Code:[id number])

BI-RADS CATEGORY  1: Negative.

## 2018-06-08 ENCOUNTER — Ambulatory Visit (INDEPENDENT_AMBULATORY_CARE_PROVIDER_SITE_OTHER): Payer: Medicare Other | Admitting: Family Medicine

## 2018-06-08 ENCOUNTER — Encounter: Payer: Self-pay | Admitting: Family Medicine

## 2018-06-08 VITALS — BP 132/83 | HR 70 | Temp 97.7°F | Resp 16 | Ht 62.0 in | Wt 164.1 lb

## 2018-06-08 DIAGNOSIS — H9193 Unspecified hearing loss, bilateral: Secondary | ICD-10-CM

## 2018-06-08 NOTE — Progress Notes (Signed)
OFFICE VISIT  06/08/2018   CC:  Chief Complaint  Patient presents with  . Hearing Problem   HPI:    Patient is a 67 y.o. Caucasian female who presents for hearing complaints. Feels like she has gradually been talking louder.  L>>R ear ringing x 2-3 yrs. Notes gradual onset of bilat hearing deficit, couple years. No asymmetry of hearing deficit is detected by pt. Has to have people repeat themselves. No noise pollution.  No correlation of hearing deficit with getting on any med or vaccination.   Past Medical History:  Diagnosis Date  . Allergic rhinitis   . Anxiety   . Asymptomatic gallstones 11/2017   Noted on KUB  . Chronic renal insufficiency, stage 2 (mild) 2018   Stage II/III: GFR high 50s-low 60s  . Diverticulosis   . GERD (gastroesophageal reflux disease)   . History of chest pain    Normal ETT 2012  . Hypertension   . IFG (impaired fasting glucose) 01/2017   Hb A1c 6%  . Lipoma of neck    R post cerv area  . Posterior vitreous detachment of right eye 2017   Dr. Larose Kells  . Right knee DJD 01/2014   per old records, x-ray showed mild tricompartmental DJD with large joint effusion.  . Seasonal allergies   . Trigger middle finger of left hand 01/2016   Dr. Amedeo Plenty aspirated/injected this    Past Surgical History:  Procedure Laterality Date  . CATARACT EXTRACTION W/ INTRAOCULAR LENS IMPLANT Right 09/17/2015  . [place]  . COLONOSCOPY  11/07/05; 11/2015   2017 tubular adenoma: recall 5 yrs  . DEXA  08/05/11   Normal  . DEXA  09/03/2017   T score -1.5.  Rpt 2 yrs.  Marland Kitchen ETT  07/12/11   Normal (at [facility]) per old records  . EYE MUSCLE SURGERY Bilateral 1965  . PILONIDAL CYST EXCISION  1975   Social History   Socioeconomic History  . Marital status: Single    Spouse name: Not on file  . Number of children: Not on file  . Years of education: Not on file  . Highest education level: Not on file  Occupational History  . Not on file  Social Needs   . Financial resource strain: Not on file  . Food insecurity:    Worry: Not on file    Inability: Not on file  . Transportation needs:    Medical: Not on file    Non-medical: Not on file  Tobacco Use  . Smoking status: Never Smoker  . Smokeless tobacco: Never Used  Substance and Sexual Activity  . Alcohol use: Yes    Alcohol/week: 0.0 standard drinks    Comment: rare alcohol intake  . Drug use: No  . Sexual activity: Not on file  Lifestyle  . Physical activity:    Days per week: Not on file    Minutes per session: Not on file  . Stress: Not on file  Relationships  . Social connections:    Talks on phone: Not on file    Gets together: Not on file    Attends religious service: Not on file    Active member of club or organization: Not on file    Attends meetings of clubs or organizations: Not on file    Relationship status: Not on file  Other Topics Concern  . Not on file  Social History Narrative   Single, moved to [place] from [place] in 2014 to  be closer to her son and grandchildren.   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   Occ alcohol.   No tobacco or drugs.   Active in "play" but no formal exercise regimen.    Outpatient Medications Prior to Visit  Medication Sig Dispense Refill  . Aspirin-Caffeine (BACK & BODY EXTRA STRENGTH PO) Take by mouth. Reported on 01/22/2016    . Calcium Carb-Cholecalciferol (CALCIUM 600+D) 600-800 MG-UNIT TABS Take by mouth.    . calcium carbonate (TUMS - DOSED IN MG ELEMENTAL CALCIUM) 500 MG chewable tablet Chew 1 tablet by mouth daily.    . hydrocortisone (ANUSOL-HC) 2.5 % rectal cream Place 1 application rectally 2 (two) times daily. 30 g 1  . irbesartan (AVAPRO) 75 MG tablet Take 1 tablet (75 mg total) by mouth daily. OFFICE VISIT NEEDED 90 tablet 0  . meloxicam (MOBIC) 15 MG tablet 1 tab po qd prn joint aches/stiffness 30 tablet 3  . Multiple Vitamins-Minerals (WOMENS 50+ MULTI VITAMIN/MIN PO)  Take 1 tablet by mouth daily. Reported on 01/22/2016    . pantoprazole (PROTONIX) 40 MG tablet TAKE ONE TABLET BY MOUTH EVERY DAY 90 tablet 3   No facility-administered medications prior to visit.     Allergies  Allergen Reactions  . Tetracyclines & Related Rash    ROS As per HPI  PE: Blood pressure 132/83, pulse 70, temperature 97.7 F (36.5 C), temperature source Oral, resp. rate 16, height 5\' 2"  (1.575 m), weight 164 lb 2 oz (74.4 kg), SpO2 98 %. Gen: Alert, well appearing.  Patient is oriented to person, place, time, and situation. AFFECT: pleasant, lucid thought and speech. ENT: Ears: EACs clear, normal epithelium.  TMs with good light reflex and landmarks bilaterally.   Weber: lateralized to L ear. Rinne: air >bone conduction bilat.  LABS:    Chemistry      Component Value Date/Time   NA 138 04/14/2018 1211   K 4.2 04/14/2018 1211   CL 103 04/14/2018 1211   CO2 28 04/14/2018 1211   BUN 16 04/14/2018 1211   CREATININE 0.96 04/14/2018 1211      Component Value Date/Time   CALCIUM 9.8 04/14/2018 1211   ALKPHOS 77 12/05/2017 1341   AST 20 12/05/2017 1341   ALT 27 12/05/2017 1341   BILITOT 0.7 12/05/2017 1341     Lab Results  Component Value Date   WBC 6.6 12/05/2017   HGB 15.0 12/05/2017   HCT 44.1 12/05/2017   MCV 87.4 12/05/2017   PLT 360.0 12/05/2017   Lab Results  Component Value Date   HGBA1C 6.1 04/14/2018    Hearing Screening   Method: Audiometry   125Hz  250Hz  500Hz  1000Hz  2000Hz  3000Hz  4000Hz  6000Hz  8000Hz   Right ear:   35 25 20 25  40    Left ear:   25 20 25 25  50      IMPRESSION AND PLAN:  Gradual hearing loss: eval here suggests presbycusia (high frequency hearing loss bilat, perhaps a little worse on the right ear.  Ok to not do anything at this time except avoid loud noise exposure. Offered referral to audiology but she declined at this time.  An After Visit Summary was printed and given to the patient.  FOLLOW UP: Return for as  needed.  Signed:  Crissie Sickles, MD           06/08/2018

## 2018-07-20 ENCOUNTER — Other Ambulatory Visit: Payer: Self-pay

## 2018-07-20 MED ORDER — IRBESARTAN 75 MG PO TABS: 75.0000 mg | ORAL_TABLET | Freq: Every day | ORAL | 1 refills | Status: DC

## 2018-08-06 NOTE — Progress Notes (Signed)
Subjective:   Kelly Barnett is a 67 y.o. female who presents for Medicare Annual (Subsequent) preventive examination.  Review of Systems:  No ROS.  Medicare Wellness Visit. Additional risk factors are reflected in the social history.  Cardiac Risk Factors include: advanced age (>4men, >41 women);hypertension;obesity (BMI >30kg/m2);sedentary lifestyle    Sleep patterns: Sleeps 7 hours.  Home Safety/Smoke Alarms: Feels safe in home. Smoke alarms in place.  Living environment; residence and Firearm Safety:Lives alone in 1 story home.  Seat Belt Safety/Bike Helmet: Wears seat belt.   Female:   Pap-N/A       Mammo-11/05/2017, BI-RADS CATEGORY  1: Negative.       Dexa scan-09/03/2017, Osteopenia.       CCS-Colonoscopy 11/30/2015, polyp. Recall 5 years.      Objective:     Vitals: BP 138/68 (BP Location: Left Arm, Patient Position: Sitting, Cuff Size: Normal)   Pulse 75   Ht 5\' 2"  (1.575 m)   Wt 163 lb 4 oz (74 kg)   SpO2 97%   BMI 29.86 kg/m   Body mass index is 29.86 kg/m.  Advanced Directives 08/07/2018 08/05/2017 11/16/2015  Does Patient Have a Medical Advance Directive? No No No  Would patient like information on creating a medical advance directive? No - Patient declined No - Patient declined -    Tobacco Social History   Tobacco Use  Smoking Status Never Smoker  Smokeless Tobacco Never Used     Counseling given: Not Answered   Past Medical History:  Diagnosis Date  . Allergic rhinitis   . Anxiety   . Asymptomatic gallstones 11/2017   Noted on [facility]  . Chronic renal insufficiency, stage 2 (mild) 2018   Stage II/III: GFR high 50s-low 60s  . Diverticulosis   . GERD (gastroesophageal reflux disease)   . History of chest pain    Normal ETT 2012  . Hypertension   . IFG (impaired fasting glucose) 01/2017   Hb A1c 6%  . Lipoma of neck    R post cerv area  . Posterior vitreous detachment of right eye 2017   Dr. Larose Kells  . Right knee DJD 01/2014   per  old records, x-ray showed mild tricompartmental DJD with large joint effusion.  . Seasonal allergies   . Trigger middle finger of left hand 01/2016   Dr. Amedeo Plenty aspirated/injected this   Past Surgical History:  Procedure Laterality Date  . CATARACT EXTRACTION W/ INTRAOCULAR LENS IMPLANT Right 09/17/2015  . [place]  . COLONOSCOPY  11/07/05; 11/2015   2017 tubular adenoma: recall 5 yrs  . DEXA  08/05/11   Normal  . DEXA  09/03/2017   T score -1.5.  Rpt 2 yrs.  Marland Kitchen ETT  07/12/11   Normal (at [facility]) per old records  . EYE MUSCLE SURGERY Bilateral 1965  . PILONIDAL CYST EXCISION  1975   Family History  Problem Relation Age of Onset  . Arthritis Mother   . Alcohol abuse Father   . Early death Father        ruptured blood vessel in throat  . Lung cancer Maternal Uncle   . Arthritis Maternal Grandmother   . Diabetes Maternal Grandmother   . Prostate cancer Maternal Grandfather   . Leukemia Other   . Colon cancer Neg Hx   . Esophageal cancer Neg Hx   . Rectal cancer Neg Hx   . Stomach cancer Neg Hx    Social History   Socioeconomic History  .  Marital status: Single    Spouse name: Not on file  . Number of children: Not on file  . Years of education: Not on file  . Highest education level: Not on file  Occupational History  . Not on file  Social Needs  . Financial resource strain: Not on file  . Food insecurity:    Worry: Not on file    Inability: Not on file  . Transportation needs:    Medical: Not on file    Non-medical: Not on file  Tobacco Use  . Smoking status: Never Smoker  . Smokeless tobacco: Never Used  Substance and Sexual Activity  . Alcohol use: Yes    Alcohol/week: 0.0 standard drinks    Comment: rare alcohol intake  . Drug use: No  . Sexual activity: Not on file  Lifestyle  . Physical activity:    Days per week: Not on file    Minutes per session: Not on file  . Stress: Not on file  Relationships  . Social connections:    Talks  on phone: Not on file    Gets together: Not on file    Attends religious service: Not on file    Active member of club or organization: Not on file    Attends meetings of clubs or organizations: Not on file    Relationship status: Not on file  Other Topics Concern  . Not on file  Social History Narrative   Single, moved to [place] from [place] in 2014 to be closer to her son and grandchildren.   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   Occ alcohol.   No tobacco or drugs.   Active in "play" but no formal exercise regimen.    Outpatient Encounter Medications as of 08/07/2018  Medication Sig  . Aspirin-Caffeine (BACK & BODY EXTRA STRENGTH PO) Take by mouth. Reported on 01/22/2016  . Calcium Carb-Cholecalciferol (CALCIUM 600+D) 600-800 MG-UNIT TABS Take by mouth.  . calcium carbonate (TUMS - DOSED IN MG ELEMENTAL CALCIUM) 500 MG chewable tablet Chew 1 tablet by mouth daily.  . hydrocortisone (ANUSOL-HC) 2.5 % rectal cream Place 1 application rectally 2 (two) times daily.  . irbesartan (AVAPRO) 75 MG tablet Take 1 tablet (75 mg total) by mouth daily. OFFICE VISIT NEEDED  . meloxicam (MOBIC) 15 MG tablet 1 tab po qd prn joint aches/stiffness  . Multiple Vitamins-Minerals (WOMENS 50+ MULTI VITAMIN/MIN PO) Take 1 tablet by mouth daily. Reported on 01/22/2016  . pantoprazole (PROTONIX) 40 MG tablet TAKE ONE TABLET BY MOUTH EVERY DAY   No facility-administered encounter medications on file as of 08/07/2018.     Activities of Daily Living In your present state of health, do you have any difficulty performing the following activities: 08/07/2018  Hearing? N  Vision? N  Difficulty concentrating or making decisions? N  Walking or climbing stairs? N  Dressing or bathing? N  Doing errands, shopping? N  Preparing Food and eating ? N  Using the Toilet? N  In the past six months, have you accidently leaked urine? N  Do you have problems with loss of bowel  control? N  Managing your Medications? N  Managing your Finances? N  Housekeeping or managing your Housekeeping? N  Some recent data might be hidden    Patient Care Team: Tammi Sou, MD as PCP - General (Family Medicine) McGowen, Adrian Blackwater, MD (Family Medicine) Druscilla Brownie, MD as Consulting Physician (Dermatology) Roseanne Kaufman, MD  as Consulting Physician (Orthopedic Surgery) Leonia Corona, MD as Consulting Physician (Ophthalmology)    Assessment:   This is a routine wellness examination for Kelly Barnett.  Exercise Activities and Dietary recommendations Exercise limited by: None identified   Diet (meal preparation, eat out, water intake, caffeinated beverages, dairy products, fruits and vegetables): Drinks coffee, tea and water.   Breakfast: oatmeal; coffee; OJ with meds Lunch: leftovers; cottage cheese; fruits; frozen meals Dinner: Vegetables; sandwiches "I nibble a lot"  Goals      Patient Stated   . Patient Stated (pt-stated)     Maintain current health.      Other   . Weight (lb) < 155 lb (70.3 kg)     Lose weight by decreasing caloric intake and increasing activity.        Fall Risk Fall Risk  08/07/2018 08/05/2017 08/09/2015  Falls in the past year? 0 No No    Depression Screen PHQ 2/9 Scores 08/07/2018 08/05/2017 08/09/2015  PHQ - 2 Score 0 0 0     Cognitive Function MMSE - Mini Mental State Exam 08/07/2018 08/05/2017  Orientation to time 5 5  Orientation to Place 5 5  Registration 3 3  Attention/ Calculation 5 5  Recall 3 2  Language- name 2 objects 2 2  Language- repeat 1 1  Language- follow 3 step command 3 3  Language- read & follow direction 1 1  Write a sentence 1 1  Copy design 1 1  Total score 30 29        Immunization History  Administered Date(s) Administered  . Tdap 07/31/2011    Screening Tests Health Maintenance  Topic Date Due  . INFLUENZA VACCINE  04/17/2019 (Originally 04/16/2018)  . PNA vac Low Risk Adult (1  of 2 - PCV13) 08/08/2019 (Originally 07/04/2015)  . MAMMOGRAM  11/05/2018  . COLONOSCOPY  11/29/2020  . TETANUS/TDAP  07/30/2021  . DEXA SCAN  Completed  . Hepatitis C Screening  Completed        Plan:     Shingrix Vaccine.   Continue doing brain stimulating activities (puzzles, reading, adult coloring books, staying active) to keep memory sharp.   Bring a copy of your living will and/or healthcare power of attorney to your next office visit.  I have personally reviewed and noted the following in the patient's chart:   . Medical and social history . Use of alcohol, tobacco or illicit drugs  . Current medications and supplements . Functional ability and status . Nutritional status . Physical activity . Advanced directives . List of other physicians . Hospitalizations, surgeries, and ER visits in previous 12 months . Vitals . Screenings to include cognitive, depression, and falls . Referrals and appointments  In addition, I have reviewed and discussed with patient certain preventive protocols, quality metrics, and best practice recommendations. A written personalized care plan for preventive services as well as general preventive health recommendations were provided to patient.     Gerilyn Nestle, RN  08/07/2018  PCP Notes: -Declines Flu and Pneumonia -F/U with PCP 09/2018, fasting.

## 2018-08-07 ENCOUNTER — Ambulatory Visit (INDEPENDENT_AMBULATORY_CARE_PROVIDER_SITE_OTHER): Payer: Medicare Other

## 2018-08-07 ENCOUNTER — Other Ambulatory Visit: Payer: Self-pay

## 2018-08-07 VITALS — BP 138/68 | HR 75 | Ht 62.0 in | Wt 163.2 lb

## 2018-08-07 DIAGNOSIS — Z0000 Encounter for general adult medical examination without abnormal findings: Secondary | ICD-10-CM | POA: Diagnosis not present

## 2018-08-07 NOTE — Patient Instructions (Addendum)
Shingrix Vaccine.   Continue doing brain stimulating activities (puzzles, reading, adult coloring books, staying active) to keep memory sharp.   Bring a copy of your living will and/or healthcare power of attorney to your next office visit.  Health Maintenance, Female Adopting a healthy lifestyle and getting preventive care can go a long way to promote health and wellness. Talk with your health care provider about what schedule of regular examinations is right for you. This is a good chance for you to check in with your provider about disease prevention and staying healthy. In between checkups, there are plenty of things you can do on your own. Experts have done a lot of research about which lifestyle changes and preventive measures are most likely to keep you healthy. Ask your health care provider for more information. Weight and diet Eat a healthy diet  Be sure to include plenty of vegetables, fruits, low-fat dairy products, and lean protein.  Do not eat a lot of foods high in solid fats, added sugars, or salt.  Get regular exercise. This is one of the most important things you can do for your health. ? Most adults should exercise for at least 150 minutes each week. The exercise should increase your heart rate and make you sweat (moderate-intensity exercise). ? Most adults should also do strengthening exercises at least twice a week. This is in addition to the moderate-intensity exercise.  Maintain a healthy weight  Body mass index (BMI) is a measurement that can be used to identify possible weight problems. It estimates body fat based on height and weight. Your health care provider can help determine your BMI and help you achieve or maintain a healthy weight.  For females 68 years of age and older: ? A BMI below 18.5 is considered underweight. ? A BMI of 18.5 to 24.9 is normal. ? A BMI of 25 to 29.9 is considered overweight. ? A BMI of 30 and above is considered obese.  Watch levels  of cholesterol and blood lipids  You should start having your blood tested for lipids and cholesterol at 68 years of age, then have this test every 5 years.  You may need to have your cholesterol levels checked more often if: ? Your lipid or cholesterol levels are high. ? You are older than 68 years of age. ? You are at high risk for heart disease.  Cancer screening Lung Cancer  Lung cancer screening is recommended for adults 68-16 years old who are at high risk for lung cancer because of a history of smoking.  A yearly low-dose CT scan of the lungs is recommended for people who: ? Currently smoke. ? Have quit within the past 15 years. ? Have at least a 30-pack-year history of smoking. A pack year is smoking an average of one pack of cigarettes a day for 1 year.  Yearly screening should continue until it has been 15 years since you quit.  Yearly screening should stop if you develop a health problem that would prevent you from having lung cancer treatment.  Breast Cancer  Practice breast self-awareness. This means understanding how your breasts normally appear and feel.  It also means doing regular breast self-exams. Let your health care provider know about any changes, no matter how small.  If you are in your 20s or 30s, you should have a clinical breast exam (CBE) by a health care provider every 1-3 years as part of a regular health exam.  If you are 40 or older,  have a CBE every year. Also consider having a breast X-ray (mammogram) every year.  If you have a family history of breast cancer, talk to your health care provider about genetic screening.  If you are at high risk for breast cancer, talk to your health care provider about having an MRI and a mammogram every year.  Breast cancer gene (BRCA) assessment is recommended for women who have family members with BRCA-related cancers. BRCA-related cancers include: ? Breast. ? Ovarian. ? Tubal. ? Peritoneal  cancers.  Results of the assessment will determine the need for genetic counseling and BRCA1 and BRCA2 testing.  Cervical Cancer Your health care provider may recommend that you be screened regularly for cancer of the pelvic organs (ovaries, uterus, and vagina). This screening involves a pelvic examination, including checking for microscopic changes to the surface of your cervix (Pap test). You may be encouraged to have this screening done every 3 years, beginning at age 21.  For women ages 30-65, health care providers may recommend pelvic exams and Pap testing every 3 years, or they may recommend the Pap and pelvic exam, combined with testing for human papilloma virus (HPV), every 5 years. Some types of HPV increase your risk of cervical cancer. Testing for HPV may also be done on women of any age with unclear Pap test results.  Other health care providers may not recommend any screening for nonpregnant women who are considered low risk for pelvic cancer and who do not have symptoms. Ask your health care provider if a screening pelvic exam is right for you.  If you have had past treatment for cervical cancer or a condition that could lead to cancer, you need Pap tests and screening for cancer for at least 20 years after your treatment. If Pap tests have been discontinued, your risk factors (such as having a new sexual partner) need to be reassessed to determine if screening should resume. Some women have medical problems that increase the chance of getting cervical cancer. In these cases, your health care provider may recommend more frequent screening and Pap tests.  Colorectal Cancer  This type of cancer can be detected and often prevented.  Routine colorectal cancer screening usually begins at 68 years of age and continues through 68 years of age.  Your health care provider may recommend screening at an earlier age if you have risk factors for colon cancer.  Your health care provider may also  recommend using home test kits to check for hidden blood in the stool.  A small camera at the end of a tube can be used to examine your colon directly (sigmoidoscopy or colonoscopy). This is done to check for the earliest forms of colorectal cancer.  Routine screening usually begins at age 50.  Direct examination of the colon should be repeated every 5-10 years through 68 years of age. However, you may need to be screened more often if early forms of precancerous polyps or small growths are found.  Skin Cancer  Check your skin from head to toe regularly.  Tell your health care provider about any new moles or changes in moles, especially if there is a change in a mole's shape or color.  Also tell your health care provider if you have a mole that is larger than the size of a pencil eraser.  Always use sunscreen. Apply sunscreen liberally and repeatedly throughout the day.  Protect yourself by wearing long sleeves, pants, a wide-brimmed hat, and sunglasses whenever you are outside.    Heart disease, diabetes, and high blood pressure  High blood pressure causes heart disease and increases the risk of stroke. High blood pressure is more likely to develop in: ? People who have blood pressure in the high end of the normal range (130-139/85-89 mm Hg). ? People who are overweight or obese. ? People who are African American.  If you are 70-78 years of age, have your blood pressure checked every 3-5 years. If you are 24 years of age or older, have your blood pressure checked every year. You should have your blood pressure measured twice-once when you are at a hospital or clinic, and once when you are not at a hospital or clinic. Record the average of the two measurements. To check your blood pressure when you are not at a hospital or clinic, you can use: ? An automated blood pressure machine at a pharmacy. ? A home blood pressure monitor.  If you are between 77 years and 5 years old, ask your  health care provider if you should take aspirin to prevent strokes.  Have regular diabetes screenings. This involves taking a blood sample to check your fasting blood sugar level. ? If you are at a normal weight and have a low risk for diabetes, have this test once every three years after 68 years of age. ? If you are overweight and have a high risk for diabetes, consider being tested at a younger age or more often. Preventing infection Hepatitis B  If you have a higher risk for hepatitis B, you should be screened for this virus. You are considered at high risk for hepatitis B if: ? You were born in a country where hepatitis B is common. Ask your health care provider which countries are considered high risk. ? Your parents were born in a high-risk country, and you have not been immunized against hepatitis B (hepatitis B vaccine). ? You have HIV or AIDS. ? You use needles to inject street drugs. ? You live with someone who has hepatitis B. ? You have had sex with someone who has hepatitis B. ? You get hemodialysis treatment. ? You take certain medicines for conditions, including cancer, organ transplantation, and autoimmune conditions.  Hepatitis C  Blood testing is recommended for: ? Everyone born from [birth year] through 1966. ? Anyone with known risk factors for hepatitis C.  Sexually transmitted infections (STIs)  You should be screened for sexually transmitted infections (STIs) including gonorrhea and chlamydia if: ? You are sexually active and are younger than 68 years of age. ? You are older than 68 years of age and your health care provider tells you that you are at risk for this type of infection. ? Your sexual activity has changed since you were last screened and you are at an increased risk for chlamydia or gonorrhea. Ask your health care provider if you are at risk.  If you do not have HIV, but are at risk, it may be recommended that you take a prescription medicine daily to  prevent HIV infection. This is called pre-exposure prophylaxis (PrEP). You are considered at risk if: ? You are sexually active and do not regularly use condoms or know the HIV status of your partner(s). ? You take drugs by injection. ? You are sexually active with a partner who has HIV.  Talk with your health care provider about whether you are at high risk of being infected with HIV. If you choose to begin PrEP, you should first be tested for HIV.  You should then be tested every 3 months for as long as you are taking PrEP. Pregnancy  If you are premenopausal and you may become pregnant, ask your health care provider about preconception counseling.  If you may become pregnant, take 400 to 800 micrograms (mcg) of folic acid every day.  If you want to prevent pregnancy, talk to your health care provider about birth control (contraception). Osteoporosis and menopause  Osteoporosis is a disease in which the bones lose minerals and strength with aging. This can result in serious bone fractures. Your risk for osteoporosis can be identified using a bone density scan.  If you are 12 years of age or older, or if you are at risk for osteoporosis and fractures, ask your health care provider if you should be screened.  Ask your health care provider whether you should take a calcium or vitamin D supplement to lower your risk for osteoporosis.  Menopause may have certain physical symptoms and risks.  Hormone replacement therapy may reduce some of these symptoms and risks. Talk to your health care provider about whether hormone replacement therapy is right for you. Follow these instructions at home:  Schedule regular health, dental, and eye exams.  Stay current with your immunizations.  Do not use any tobacco products including cigarettes, chewing tobacco, or electronic cigarettes.  If you are pregnant, do not drink alcohol.  If you are breastfeeding, limit how much and how often you drink  alcohol.  Limit alcohol intake to no more than 1 drink per day for nonpregnant women. One drink equals 12 ounces of beer, 5 ounces of wine, or 1 ounces of hard liquor.  Do not use street drugs.  Do not share needles.  Ask your health care provider for help if you need support or information about quitting drugs.  Tell your health care provider if you often feel depressed.  Tell your health care provider if you have ever been abused or do not feel safe at home. This information is not intended to replace advice given to you by your health care provider. Make sure you discuss any questions you have with your health care provider. Document Released: 03/18/2011 Document Revised: 02/08/2016 Document Reviewed: 06/06/2015 Elsevier Interactive Patient Education  [employer].

## 2018-08-16 NOTE — Progress Notes (Signed)
AWV reviewed and agree. Signed:  Crissie Sickles, MD           08/16/2018

## 2018-09-21 ENCOUNTER — Other Ambulatory Visit: Payer: Self-pay

## 2018-09-21 ENCOUNTER — Encounter: Payer: Self-pay | Admitting: Family Medicine

## 2018-09-21 DIAGNOSIS — L918 Other hypertrophic disorders of the skin: Secondary | ICD-10-CM | POA: Diagnosis not present

## 2018-09-21 DIAGNOSIS — L820 Inflamed seborrheic keratosis: Secondary | ICD-10-CM | POA: Diagnosis not present

## 2018-09-21 DIAGNOSIS — C44619 Basal cell carcinoma of skin of left upper limb, including shoulder: Secondary | ICD-10-CM | POA: Diagnosis not present

## 2018-09-21 DIAGNOSIS — D485 Neoplasm of uncertain behavior of skin: Secondary | ICD-10-CM | POA: Diagnosis not present

## 2018-09-22 ENCOUNTER — Other Ambulatory Visit: Payer: Self-pay | Admitting: Family Medicine

## 2018-09-22 DIAGNOSIS — Z1231 Encounter for screening mammogram for malignant neoplasm of breast: Secondary | ICD-10-CM

## 2018-10-15 ENCOUNTER — Ambulatory Visit (INDEPENDENT_AMBULATORY_CARE_PROVIDER_SITE_OTHER): Payer: Medicare Other | Admitting: Family Medicine

## 2018-10-15 ENCOUNTER — Encounter: Payer: Self-pay | Admitting: Family Medicine

## 2018-10-15 VITALS — BP 129/78 | HR 70 | Temp 98.1°F | Resp 16 | Ht 62.0 in | Wt 164.4 lb

## 2018-10-15 DIAGNOSIS — R7303 Prediabetes: Secondary | ICD-10-CM | POA: Diagnosis not present

## 2018-10-15 DIAGNOSIS — N182 Chronic kidney disease, stage 2 (mild): Secondary | ICD-10-CM

## 2018-10-15 DIAGNOSIS — I10 Essential (primary) hypertension: Secondary | ICD-10-CM | POA: Diagnosis not present

## 2018-10-15 LAB — BASIC METABOLIC PANEL
BUN: 19 mg/dL (ref 6–23)
CALCIUM: 9.7 mg/dL (ref 8.4–10.5)
CHLORIDE: 104 meq/L (ref 96–112)
CO2: 27 meq/L (ref 19–32)
Creatinine, Ser: 1 mg/dL (ref 0.40–1.20)
GFR: 55.09 mL/min — ABNORMAL LOW (ref >60.00)
Glucose, Bld: 86 mg/dL (ref 70–99)
POTASSIUM: 4.7 meq/L (ref 3.5–5.1)
SODIUM: 138 meq/L (ref 135–145)

## 2018-10-15 LAB — HEMOGLOBIN A1C: HEMOGLOBIN A1C: 6 % (ref 4.6–6.5)

## 2018-10-15 NOTE — Progress Notes (Signed)
OFFICE VISIT  10/15/2018   CC:  Chief Complaint  Patient presents with  . Follow-up    RCI, pt is fasting.    HPI:    Patient is a 68 y.o. Caucasian female who presents for 6 mo f/u HTN, prediabetes, and CRI II/III.  Blood pressures: consistently normal.  Feels more fatigued when it goes into the 409 systolic range.  Diet: trying some dieting but wt going up slowly still.  She does not exercise any. She is considering joining a gym.    CRI: trying to stay well hydrated with water.  Cutting back on coffee intake. NSAIDs: rarely takes a meloxicam.  Says it does not help.  Rarely has to take [employer]ayer back and body.  Past Medical History:  Diagnosis Date  . Allergic rhinitis   . Anxiety   . Asymptomatic gallstones 11/2017   Noted on KUB  . Basal cell carcinoma    left arm  . Chronic renal insufficiency, stage 2 (mild) 2018   Stage II/III: GFR high 50s-low 60s  . Diverticulosis   . GERD (gastroesophageal reflux disease)   . History of chest pain    Normal ETT 2012  . Hypertension   . IFG (impaired fasting glucose) 01/2017   Hb A1c 6%  . Lipoma of neck    R post cerv area  . Posterior vitreous detachment of right eye 2017   Dr. Larose Kells  . Right knee DJD 01/2014   per old records, x-ray showed mild tricompartmental DJD with large joint effusion.  . Seasonal allergies   . Trigger middle finger of left hand 01/2016   Dr. Amedeo Plenty aspirated/injected this    Past Surgical History:  Procedure Laterality Date  . CATARACT EXTRACTION W/ INTRAOCULAR LENS IMPLANT Right 09/17/2015  . [place]  . COLONOSCOPY  11/07/05; 11/2015   2017 tubular adenoma: recall 5 yrs  . DEXA  08/05/11   Normal  . DEXA  09/03/2017   T score -1.5.  Rpt 2 yrs.  Marland Kitchen ETT  07/12/11   Normal (at [facility]) per old records  . EYE MUSCLE SURGERY Bilateral 1965  . PILONIDAL CYST EXCISION  1975    Outpatient Medications Prior to Visit  Medication Sig Dispense Refill  . Aspirin-Caffeine (BACK &  BODY EXTRA STRENGTH PO) Take by mouth. Reported on 01/22/2016    . Calcium Carb-Cholecalciferol (CALCIUM 600+D) 600-800 MG-UNIT TABS Take by mouth.    . calcium carbonate (TUMS - DOSED IN MG ELEMENTAL CALCIUM) 500 MG chewable tablet Chew 1 tablet by mouth daily.    . hydrocortisone (ANUSOL-HC) 2.5 % rectal cream Place 1 application rectally 2 (two) times daily. 30 g 1  . irbesartan (AVAPRO) 75 MG tablet Take 1 tablet (75 mg total) by mouth daily. OFFICE VISIT NEEDED 90 tablet 1  . meloxicam (MOBIC) 15 MG tablet 1 tab po qd prn joint aches/stiffness 30 tablet 3  . Multiple Vitamins-Minerals (WOMENS 50+ MULTI VITAMIN/MIN PO) Take 1 tablet by mouth daily. Reported on 01/22/2016    . pantoprazole (PROTONIX) 40 MG tablet TAKE ONE TABLET BY MOUTH EVERY DAY 90 tablet 3   No facility-administered medications prior to visit.     Allergies  Allergen Reactions  . Tetracyclines & Related Rash    ROS As per HPI  PE: Blood pressure 129/78, pulse 70, temperature 98.1 F (36.7 C), temperature source Oral, resp. rate 16, height 5\' 2"  (1.575 m), weight 164 lb 6 oz (74.6 kg), SpO2 99 %. Gen: Alert,  well appearing.  Patient is oriented to person, place, time, and situation. AFFECT: pleasant, lucid thought and speech. CV: RRR, no m/r/g.   LUNGS: CTA bilat, nonlabored resps, good aeration in all lung fields. EXT: no clubbing or cyanosis.  no edema.    LABS:  Lab Results  Component Value Date   TSH 0.81 01/22/2017   Lab Results  Component Value Date   WBC 6.6 12/05/2017   HGB 15.0 12/05/2017   HCT 44.1 12/05/2017   MCV 87.4 12/05/2017   PLT 360.0 12/05/2017   Lab Results  Component Value Date   CREATININE 0.96 04/14/2018   BUN 16 04/14/2018   NA 138 04/14/2018   K 4.2 04/14/2018   CL 103 04/14/2018   CO2 28 04/14/2018   Lab Results  Component Value Date   ALT 27 12/05/2017   AST 20 12/05/2017   ALKPHOS 77 12/05/2017   BILITOT 0.7 12/05/2017   Lab Results  Component Value Date   CHOL  134 09/03/2017   Lab Results  Component Value Date   HDL 36.50 (L) 09/03/2017   Lab Results  Component Value Date   LDLCALC 107 (H) 02/28/2015   Lab Results  Component Value Date   TRIG 211.0 (H) 09/03/2017   Lab Results  Component Value Date   CHOLHDL 4 09/03/2017   Lab Results  Component Value Date   HGBA1C 6.1 04/14/2018     IMPRESSION AND PLAN:  1) HTN: The current medical regimen is effective;  continue present plan and medications. BMET today.  2) Prediabetes: she'll continue to try to make small changes/TLC. Hba1c check today.  3) CRI II/III:  Doing well with hydration and avoiding NSAIDs. Lytes/cr check today.  An After Visit Summary was printed and given to the patient.  FOLLOW UP: 6 mo RCI  Signed:  Crissie Sickles, MD           10/15/2018

## 2018-10-16 ENCOUNTER — Encounter: Payer: Self-pay | Admitting: *Deleted

## 2018-10-22 DIAGNOSIS — C44619 Basal cell carcinoma of skin of left upper limb, including shoulder: Secondary | ICD-10-CM | POA: Diagnosis not present

## 2018-11-11 ENCOUNTER — Ambulatory Visit (INDEPENDENT_AMBULATORY_CARE_PROVIDER_SITE_OTHER): Payer: Medicare Other

## 2018-11-11 DIAGNOSIS — Z1231 Encounter for screening mammogram for malignant neoplasm of breast: Secondary | ICD-10-CM

## 2019-01-15 ENCOUNTER — Other Ambulatory Visit: Payer: Self-pay | Admitting: Family Medicine

## 2019-02-23 ENCOUNTER — Other Ambulatory Visit: Payer: Self-pay

## 2019-02-23 DIAGNOSIS — Z85828 Personal history of other malignant neoplasm of skin: Secondary | ICD-10-CM | POA: Diagnosis not present

## 2019-02-23 DIAGNOSIS — D229 Melanocytic nevi, unspecified: Secondary | ICD-10-CM | POA: Diagnosis not present

## 2019-02-23 MED ORDER — PANTOPRAZOLE SODIUM 40 MG PO TBEC: 40.0000 mg | DELAYED_RELEASE_TABLET | Freq: Every day | ORAL | 1 refills | Status: DC

## 2019-07-07 ENCOUNTER — Other Ambulatory Visit: Payer: Self-pay | Admitting: Family Medicine

## 2019-07-07 ENCOUNTER — Ambulatory Visit (INDEPENDENT_AMBULATORY_CARE_PROVIDER_SITE_OTHER): Payer: Medicare Other | Admitting: Family Medicine

## 2019-07-07 ENCOUNTER — Encounter: Payer: Self-pay | Admitting: Family Medicine

## 2019-07-07 ENCOUNTER — Other Ambulatory Visit: Payer: Self-pay

## 2019-07-07 VITALS — BP 116/85 | HR 91 | Temp 98.3°F | Resp 16 | Ht 62.0 in | Wt 161.8 lb

## 2019-07-07 DIAGNOSIS — R300 Dysuria: Secondary | ICD-10-CM | POA: Diagnosis not present

## 2019-07-07 DIAGNOSIS — R319 Hematuria, unspecified: Secondary | ICD-10-CM | POA: Diagnosis not present

## 2019-07-07 DIAGNOSIS — N3001 Acute cystitis with hematuria: Secondary | ICD-10-CM | POA: Diagnosis not present

## 2019-07-07 LAB — POCT URINALYSIS DIPSTICK
Bilirubin, UA: NEGATIVE
Glucose, UA: NEGATIVE
Ketones, UA: NEGATIVE
Nitrite, UA: NEGATIVE
Protein, UA: POSITIVE — AB
Spec Grav, UA: 1.025 (ref 1.010–1.025)
Urobilinogen, UA: 0.2 E.U./dL
pH, UA: 6 (ref 5.0–8.0)

## 2019-07-07 MED ORDER — SULFAMETHOXAZOLE-TRIMETHOPRIM 800-160 MG PO TABS: 1.0000 | ORAL_TABLET | Freq: Two times a day (BID) | ORAL | 0 refills | Status: DC

## 2019-07-07 NOTE — Progress Notes (Signed)
OFFICE VISIT  07/07/2019   CC:  Chief Complaint  Patient presents with  . Dysuria    x 2 days  . Hematuria    x 2 days   HPI:    Patient is a 68 y.o. Caucasian female who presents for urinary complaints. Acute onset of sx's 36h ago, dysuria, urinary urgency and frequency, noted faint amount of blood in urine and on toilet tissue.  No nausea, no fever, no flank pain.  ROS: no CP, no SOB, no wheezing, no cough, no dizziness, no HAs, no rashes, no melena/hematochezia.  No polyuria or polydipsia.  No myalgias or arthralgias.  Past Medical History:  Diagnosis Date  . Allergic rhinitis   . Anxiety   . Asymptomatic gallstones 11/2017   Noted on KUB  . Basal cell carcinoma    left arm  . Chronic renal insufficiency, stage 2 (mild) 2018   Stage II/III: GFR high 50s-low 60s  . Diverticulosis   . GERD (gastroesophageal reflux disease)   . History of chest pain    Normal ETT 2012  . Hypertension   . IFG (impaired fasting glucose) 01/2017   Hb A1c 6%  . Lipoma of neck    R post cerv area  . Posterior vitreous detachment of right eye 2017   Dr. Larose Kells  . Right knee DJD 01/2014   per old records, x-ray showed mild tricompartmental DJD with large joint effusion.  . Seasonal allergies   . Trigger middle finger of left hand 01/2016   Dr. Amedeo Plenty aspirated/injected this    Past Surgical History:  Procedure Laterality Date  . CATARACT EXTRACTION W/ INTRAOCULAR LENS IMPLANT Right 09/17/2015  . [place]  . COLONOSCOPY  11/07/05; 11/2015   2017 tubular adenoma: recall 5 yrs  . DEXA  08/05/11   Normal  . DEXA  09/03/2017   T score -1.5.  Rpt 2 yrs.  Marland Kitchen ETT  07/12/11   Normal (at [facility]) per old records  . EYE MUSCLE SURGERY Bilateral 1965  . PILONIDAL CYST EXCISION  1975    Outpatient Medications Prior to Visit  Medication Sig Dispense Refill  . Aspirin-Caffeine (BACK & BODY EXTRA STRENGTH PO) Take by mouth. Reported on 01/22/2016    . Calcium  Carb-Cholecalciferol (CALCIUM 600+D) 600-800 MG-UNIT TABS Take by mouth.    . irbesartan (AVAPRO) 75 MG tablet TAKE 1 TABLET (75 MG TOTAL) BY MOUTH DAILY. OFFICE VISIT NEEDED 90 tablet 0  . Multiple Vitamins-Minerals (WOMENS 50+ MULTI VITAMIN/MIN PO) Take 1 tablet by mouth daily. Reported on 01/22/2016    . pantoprazole (PROTONIX) 40 MG tablet Take 1 tablet (40 mg total) by mouth daily. 90 tablet 1  . calcium carbonate (TUMS - DOSED IN MG ELEMENTAL CALCIUM) 500 MG chewable tablet Chew 1 tablet by mouth daily.    . hydrocortisone (ANUSOL-HC) 2.5 % rectal cream Place 1 application rectally 2 (two) times daily. (Patient not taking: Reported on 07/07/2019) 30 g 1  . meloxicam (MOBIC) 15 MG tablet 1 tab po qd prn joint aches/stiffness (Patient not taking: Reported on 07/07/2019) 30 tablet 3   No facility-administered medications prior to visit.     Allergies  Allergen Reactions  . Tetracyclines & Related Rash    ROS As per HPI  PE: Blood pressure 116/85, pulse 91, temperature 98.3 F (36.8 C), temperature source Temporal, resp. rate 16, height 5\' 2"  (1.575 m), weight 161 lb 12.8 oz (73.4 kg), SpO2 98 %. Gen: Alert, well appearing.  Patient  is oriented to person, place, time, and situation. AFFECT: pleasant, lucid thought and speech. No further exam today.  LABS:    Chemistry      Component Value Date/Time   NA 138 10/15/2018 0950   K 4.7 10/15/2018 0950   CL 104 10/15/2018 0950   CO2 27 10/15/2018 0950   BUN 19 10/15/2018 0950   CREATININE 1.00 10/15/2018 0950      Component Value Date/Time   CALCIUM 9.7 10/15/2018 0950   ALKPHOS 77 12/05/2017 1341   AST 20 12/05/2017 1341   ALT 27 12/05/2017 1341   BILITOT 0.7 12/05/2017 1341     Lab Results  Component Value Date   HGBA1C 6.0 10/15/2018   POC CC UA today: moderate leuks, 3+ blood, SG 1.025, + protein, otherwise normal.  IMPRESSION AND PLAN:  1) Acute UTI with hematuria. Send urine for c/s. Bactrim DS 1 bid x 3d.    When she returns for CPE in 1-16mo I'll recheck UA to make sure hematuria has completely resolved.  An After Visit Summary was printed and given to the patient.  FOLLOW UP: Return for CPE IN 1-2 MONTHS.   Signed:  Crissie Sickles, MD           07/07/2019

## 2019-07-09 LAB — URINE CULTURE
MICRO NUMBER:: 1014860
SPECIMEN QUALITY:: ADEQUATE

## 2019-07-14 ENCOUNTER — Encounter: Payer: Self-pay | Admitting: Family Medicine

## 2019-07-14 MED ORDER — SULFAMETHOXAZOLE-TRIMETHOPRIM 800-160 MG PO TABS: 1.0000 | ORAL_TABLET | Freq: Two times a day (BID) | ORAL | 0 refills | Status: AC

## 2019-07-14 NOTE — Telephone Encounter (Signed)
Patient advised.  She will pick up RX and let us know if not better.

## 2019-07-14 NOTE — Telephone Encounter (Signed)
Will do another 3 days of antibiotics. If all sx's not gone after this then I'll need to have her come back for lab visit to give another urine specimen.-thx

## 2019-07-14 NOTE — Telephone Encounter (Signed)
Patient contacted and explained to her that was the bacteria that caused her UTI.   She states that she finished ABX Monday and her burning has gone away but she states she has a "pain when she pees".  Patient is drinking cranberry juice.  Anything else she can do, or does she need re-evaluated?  Please advise.

## 2019-08-24 ENCOUNTER — Ambulatory Visit: Payer: Medicare Other

## 2019-08-25 ENCOUNTER — Other Ambulatory Visit: Payer: Self-pay

## 2019-08-25 ENCOUNTER — Ambulatory Visit (INDEPENDENT_AMBULATORY_CARE_PROVIDER_SITE_OTHER): Payer: Medicare Other | Admitting: Family Medicine

## 2019-08-25 ENCOUNTER — Encounter: Payer: Self-pay | Admitting: Family Medicine

## 2019-08-25 VITALS — BP 147/89 | HR 69 | Temp 98.0°F | Resp 16 | Ht 62.0 in | Wt 167.4 lb

## 2019-08-25 DIAGNOSIS — R7301 Impaired fasting glucose: Secondary | ICD-10-CM

## 2019-08-25 DIAGNOSIS — F411 Generalized anxiety disorder: Secondary | ICD-10-CM | POA: Diagnosis not present

## 2019-08-25 DIAGNOSIS — E7800 Pure hypercholesterolemia, unspecified: Secondary | ICD-10-CM

## 2019-08-25 DIAGNOSIS — I10 Essential (primary) hypertension: Secondary | ICD-10-CM

## 2019-08-25 LAB — COMPREHENSIVE METABOLIC PANEL
ALT: 21 U/L (ref 0–35)
AST: 18 U/L (ref 0–37)
Albumin: 4.2 g/dL (ref 3.5–5.2)
Alkaline Phosphatase: 69 U/L (ref 39–117)
BUN: 16 mg/dL (ref 6–23)
CO2: 27 mEq/L (ref 19–32)
Calcium: 9.5 mg/dL (ref 8.4–10.5)
Chloride: 103 mEq/L (ref 96–112)
Creatinine, Ser: 1.01 mg/dL (ref 0.40–1.20)
GFR: 54.32 mL/min — ABNORMAL LOW (ref >60.00)
Glucose, Bld: 87 mg/dL (ref 70–99)
Potassium: 4.7 mEq/L (ref 3.5–5.1)
Sodium: 138 mEq/L (ref 135–145)
Total Bilirubin: 0.9 mg/dL (ref 0.2–1.2)
Total Protein: 6.8 g/dL (ref 6.0–8.3)

## 2019-08-25 LAB — LIPID PANEL
Cholesterol: 161 mg/dL (ref 0–200)
HDL: 40.4 mg/dL (ref >39.00)
LDL Cholesterol: 96 mg/dL (ref 0–99)
NonHDL: 120.97
Total CHOL/HDL Ratio: 4
Triglycerides: 127 mg/dL (ref 0.0–149.0)
VLDL: 25.4 mg/dL (ref 0.0–40.0)

## 2019-08-25 LAB — HEMOGLOBIN A1C: Hgb A1c MFr Bld: 6 % (ref 4.6–6.5)

## 2019-08-25 MED ORDER — IRBESARTAN 150 MG PO TABS: 150.0000 mg | ORAL_TABLET | Freq: Every day | ORAL | 0 refills | Status: DC

## 2019-08-25 NOTE — Progress Notes (Signed)
OFFICE VISIT  08/25/2019   CC:  Chief Complaint  Patient presents with  . Follow-up    RCI, pt is fasting   HPI:    Patient is a 68 y.o. Caucasian female who presents for f/u prediabetes, HTN, and CRI II/III.  BP: home monitoring->not checking any bp's at home.  Occ check recalled 130s. Just took bp med before coming in this morning.  Diet: taking "go low" to try to help lose wt.   Trying to limit pasta and bread, not exercising.  She is frustrated, mainly with herself. Gets some fingers tingling, various pains in lower sternum and L "boop" pain---always occurs when the is highly anxious b/c rushing after procrastination. The covid 19 pandemic/restrictions/stress PLAYS A HUGE ROLE IN THE WAY SHE FEELS PSYCHOLOGICALLY AND PHYSICALLY. Working full time, trying to distract herself with relaxing activities. After she is active with dialy chores but then both knees start hurting, so she does not do any exercise.  CRI: she no longer takes meloxicam.  Tries to hydrate well.  ROS: no CP, no SOB, no wheezing, no cough, no dizziness, no HAs, no rashes, no melena/hematochezia.  No polyuria or polydipsia.  No myalgias or arthralgias.   Past Medical History:  Diagnosis Date  . Allergic rhinitis   . Anxiety   . Asymptomatic gallstones 11/2017   Noted on KUB  . Basal cell carcinoma    left arm  . Chronic renal insufficiency, stage 2 (mild) 2018   Stage II/III: GFR high 50s-low 60s  . Diverticulosis   . GERD (gastroesophageal reflux disease)   . History of chest pain    Normal ETT 2012  . Hypertension   . IFG (impaired fasting glucose) 01/2017   Hb A1c 6%  . Lipoma of neck    R post cerv area  . Posterior vitreous detachment of right eye 2017   Dr. Larose Kells  . Right knee DJD 01/2014   per old records, x-ray showed mild tricompartmental DJD with large joint effusion.  . Seasonal allergies   . Trigger middle finger of left hand 01/2016   Dr. Amedeo Plenty aspirated/injected this    Past  Surgical History:  Procedure Laterality Date  . CATARACT EXTRACTION W/ INTRAOCULAR LENS IMPLANT Right 09/17/2015  . [place]  . COLONOSCOPY  11/07/05; 11/2015   2017 tubular adenoma: recall 5 yrs  . DEXA  08/05/11   Normal  . DEXA  09/03/2017   T score -1.5.  Rpt 2 yrs.  Marland Kitchen ETT  07/12/11   Normal (at [facility]) per old records  . EYE MUSCLE SURGERY Bilateral 1965  . PILONIDAL CYST EXCISION  1975    Outpatient Medications Prior to Visit  Medication Sig Dispense Refill  . Aspirin-Caffeine (BACK & BODY EXTRA STRENGTH PO) Take by mouth. Reported on 01/22/2016    . OVER THE COUNTER MEDICATION daily. [place]    . pantoprazole (PROTONIX) 40 MG tablet Take 1 tablet (40 mg total) by mouth daily. 90 tablet 1  . irbesartan (AVAPRO) 75 MG tablet TAKE 1 TABLET (75 MG TOTAL) BY MOUTH DAILY. OFFICE VISIT NEEDED 90 tablet 0  . Calcium Carb-Cholecalciferol (CALCIUM 600+D) 600-800 MG-UNIT TABS Take by mouth.    . calcium carbonate (TUMS - DOSED IN MG ELEMENTAL CALCIUM) 500 MG chewable tablet Chew 1 tablet by mouth daily.    . hydrocortisone (ANUSOL-HC) 2.5 % rectal cream Place 1 application rectally 2 (two) times daily. (Patient not taking: Reported on 07/07/2019) 30 g 1  .  Multiple Vitamins-Minerals (WOMENS 50+ MULTI VITAMIN/MIN PO) Take 1 tablet by mouth daily. Reported on 01/22/2016    . meloxicam (MOBIC) 15 MG tablet 1 tab po qd prn joint aches/stiffness (Patient not taking: Reported on 07/07/2019) 30 tablet 3   No facility-administered medications prior to visit.     Allergies  Allergen Reactions  . Tetracyclines & Related Rash    ROS As per HPI  PE: Blood pressure (!) 147/89, pulse 69, temperature 98 F (36.7 C), temperature source Temporal, resp. rate 16, height 5\' 2"  (1.575 m), weight 167 lb 6.4 oz (75.9 kg), SpO2 98 %. Gen: Alert, well appearing.  Patient is oriented to person, place, time, and situation. Tearful at times when discussing frustration of her wt/anxiety,   Etc. CV: RRR, no m/r/g.   LUNGS: CTA bilat, nonlabored resps, good aeration in all lung fields. EXT: no clubbing or cyanosis.  no edema.  No tremor.  LABS:  Lab Results  Component Value Date   TSH 0.81 01/22/2017   Lab Results  Component Value Date   WBC 6.6 12/05/2017   HGB 15.0 12/05/2017   HCT 44.1 12/05/2017   MCV 87.4 12/05/2017   PLT 360.0 12/05/2017   Lab Results  Component Value Date   CREATININE 1.00 10/15/2018   BUN 19 10/15/2018   NA 138 10/15/2018   K 4.7 10/15/2018   CL 104 10/15/2018   CO2 27 10/15/2018   Lab Results  Component Value Date   ALT 27 12/05/2017   AST 20 12/05/2017   ALKPHOS 77 12/05/2017   BILITOT 0.7 12/05/2017   Lab Results  Component Value Date   CHOL 134 09/03/2017   Lab Results  Component Value Date   HDL 36.50 (L) 09/03/2017   Lab Results  Component Value Date   LDLCALC 107 (H) 02/28/2015   Lab Results  Component Value Date   TRIG 211.0 (H) 09/03/2017   Lab Results  Component Value Date   CHOLHDL 4 09/03/2017   Lab Results  Component Value Date   HGBA1C 6.0 10/15/2018     IMPRESSION AND PLAN:  1) HTN; not well controlled. Increase irbesartan to 150mg  qd. Monitor bp/hr at home daily for the next 2 weeks so we can review these at f/u in 2 wks. BMET today.  2) Prediabetes: Encouraged pt to make better effort/more time for relaxation as well as diet/exercise changes.  3) CRI with GFR avg around 60 ml/min: avoid NSAIDs.  Hydrate well. Control bp well, monitor for DM.  4) GAD: frustrated with "covid life".  Feels trapped, can get out and do things that bring her happiness/peace. Encouraged her to start yoga/breathing exercises, exercise starting slow and building up slowly. Make simple dietary changes and slowly improve.  An After Visit Summary was printed and given to the patient.  FOLLOW UP: Return in about 2 weeks (around 09/08/2019) for f/u HTN-Virtual.  Signed:  Crissie Sickles, MD            08/25/2019

## 2019-08-26 ENCOUNTER — Encounter: Payer: Self-pay | Admitting: Family Medicine

## 2019-09-03 ENCOUNTER — Other Ambulatory Visit: Payer: Self-pay | Admitting: Family Medicine

## 2019-09-07 ENCOUNTER — Other Ambulatory Visit: Payer: Self-pay

## 2019-09-08 ENCOUNTER — Other Ambulatory Visit: Payer: Self-pay

## 2019-09-08 ENCOUNTER — Ambulatory Visit (INDEPENDENT_AMBULATORY_CARE_PROVIDER_SITE_OTHER): Payer: Medicare Other | Admitting: Family Medicine

## 2019-09-08 ENCOUNTER — Encounter: Payer: Self-pay | Admitting: Family Medicine

## 2019-09-08 VITALS — BP 129/88 | HR 74 | Wt 161.6 lb

## 2019-09-08 DIAGNOSIS — I10 Essential (primary) hypertension: Secondary | ICD-10-CM | POA: Diagnosis not present

## 2019-09-08 NOTE — Progress Notes (Signed)
Virtual Visit via Video Note  I connected with Kelly Barnett on 09/08/19 at 10:00 AM EST by a video enabled telemedicine application and verified that I am speaking with the correct person using two identifiers.  Location patient: home Location provider:work or home office Persons participating in the virtual visit: patient, provider  I discussed the limitations of evaluation and management by telemedicine and the availability of in person appointments. The patient expressed understanding and agreed to proceed.  Telemedicine visit is a necessity given the COVID-19 restrictions in place at the current time.  HPI: 68 y/o WF being seen today for 2 wk f/u HTN. A/P as of last visit: "HTN; not well controlled. Increase irbesartan to 150mg  qd. Monitor bp/hr at home daily for the next 2 weeks so we can review these at f/u in 2 wks. BMET today."  Interim hx:  BMET last visit was stable/good. bPs better on 150mg  irbesartan. Consistently <130/80. She is still working on [employer] but too cold to go out and exercise.   ROS: no CP, no SOB, no wheezing, no cough, no dizziness, no HAs, no rashes, no melena/hematochezia.  No polyuria or polydipsia.  No myalgias or arthralgias.   Past Medical History:  Diagnosis Date  . Allergic rhinitis   . Anxiety   . Asymptomatic gallstones 11/2017   Noted on KUB  . Basal cell carcinoma    left arm  . Chronic renal insufficiency, stage 2 (mild) 2018   Stage II/III: GFR high 50s-low 60s  . Diverticulosis   . GERD (gastroesophageal reflux disease)   . History of chest pain    Normal ETT 2012  . Hypertension   . Lipoma of neck    R post cerv area  . Posterior vitreous detachment of right eye 2017   Dr. Larose Kells  . Prediabetes 01/2017   Hb A1c 6%. Same 08/2019  . Right knee DJD 01/2014   per old records, x-ray showed mild tricompartmental DJD with large joint effusion.  . Seasonal allergies   . Trigger middle finger of left hand 01/2016   Dr. Amedeo Plenty  aspirated/injected this    Past Surgical History:  Procedure Laterality Date  . CATARACT EXTRACTION W/ INTRAOCULAR LENS IMPLANT Right 09/17/2015  . [place]  . COLONOSCOPY  11/07/05; 11/2015   2017 tubular adenoma: recall 5 yrs  . DEXA  08/05/11   Normal  . DEXA  09/03/2017   T score -1.5.  Rpt 2 yrs.  Marland Kitchen ETT  07/12/11   Normal (at [facility]) per old records  . EYE MUSCLE SURGERY Bilateral 1965  . PILONIDAL CYST EXCISION  1975    Family History  Problem Relation Age of Onset  . Arthritis Mother   . Alcohol abuse Father   . Early death Father        ruptured blood vessel in throat  . Lung cancer Maternal Uncle   . Arthritis Maternal Grandmother   . Diabetes Maternal Grandmother   . Prostate cancer Maternal Grandfather   . Leukemia Other   . Colon cancer Neg Hx   . Esophageal cancer Neg Hx   . Rectal cancer Neg Hx   . Stomach cancer Neg Hx      Current Outpatient Medications:  .  Aspirin-Caffeine (BACK & BODY EXTRA STRENGTH PO), Take by mouth. Reported on 01/22/2016, Disp: , Rfl:  .  Calcium Carb-Cholecalciferol (CALCIUM 600+D) 600-800 MG-UNIT TABS, Take by mouth., Disp: , Rfl:  .  calcium carbonate (TUMS - DOSED IN  MG ELEMENTAL CALCIUM) 500 MG chewable tablet, Chew 1 tablet by mouth daily., Disp: , Rfl:  .  irbesartan (AVAPRO) 150 MG tablet, Take 1 tablet (150 mg total) by mouth daily., Disp: 30 tablet, Rfl: 0 .  Multiple Vitamins-Minerals (WOMENS 50+ MULTI VITAMIN/MIN PO), Take 1 tablet by mouth daily. Reported on 01/22/2016, Disp: , Rfl:  .  pantoprazole (PROTONIX) 40 MG tablet, TAKE 1 TABLET BY MOUTH EVERY DAY, Disp: 90 tablet, Rfl: 1 .  hydrocortisone (ANUSOL-HC) 2.5 % rectal cream, Place 1 application rectally 2 (two) times daily. (Patient not taking: Reported on 07/07/2019), Disp: 30 g, Rfl: 1  EXAM:  VITALS per patient if applicable: BP [id number] (BP Location: Left Wrist, Patient Position: Sitting, Cuff Size: Normal)   Pulse 74   Wt 161 lb 9.6 oz (73.3  kg)   BMI 29.56 kg/m    GENERAL: alert, oriented, appears well and in no acute distress  HEENT: atraumatic, conjunttiva clear, no obvious abnormalities on inspection of external nose and ears  NECK: normal movements of the head and neck  LUNGS: on inspection no signs of respiratory distress, breathing rate appears normal, no obvious gross SOB, gasping or wheezing  CV: no obvious cyanosis  MS: moves all visible extremities without noticeable abnormality  PSYCH/NEURO: pleasant and cooperative, no obvious depression or anxiety, speech and thought processing grossly intact  LABS: none today    Chemistry      Component Value Date/Time   NA 138 08/25/2019 1026   K 4.7 08/25/2019 1026   CL 103 08/25/2019 1026   CO2 27 08/25/2019 1026   BUN 16 08/25/2019 1026   CREATININE 1.01 08/25/2019 1026      Component Value Date/Time   CALCIUM 9.5 08/25/2019 1026   ALKPHOS 69 08/25/2019 1026   AST 18 08/25/2019 1026   ALT 21 08/25/2019 1026   BILITOT 0.9 08/25/2019 1026      ASSESSMENT AND PLAN:  Discussed the following assessment and plan:  1) HTN, now well controlled. No new labs needed at this time. Continue to increase efforts at diet improvements and getting more active/lose some wt. Encouraged Kelly Barnett today, told her to enjoy her holiday season!   I discussed the assessment and treatment plan with the patient. The patient was provided an opportunity to ask questions and all were answered. The patient agreed with the plan and demonstrated an understanding of the instructions.   The patient was advised to call back or seek an in-person evaluation if the symptoms worsen or if the condition fails to improve as anticipated.  F/u: 6 mo RCI (medicare)-"CPE" with fasting labs  Signed:  Crissie Sickles, MD           09/08/2019

## 2019-09-16 ENCOUNTER — Other Ambulatory Visit: Payer: Self-pay | Admitting: Family Medicine

## 2019-12-11 ENCOUNTER — Other Ambulatory Visit: Payer: Self-pay | Admitting: Family Medicine

## 2019-12-13 ENCOUNTER — Other Ambulatory Visit: Payer: Self-pay | Admitting: Family Medicine

## 2019-12-13 DIAGNOSIS — Z1231 Encounter for screening mammogram for malignant neoplasm of breast: Secondary | ICD-10-CM

## 2019-12-15 ENCOUNTER — Other Ambulatory Visit: Payer: Self-pay | Admitting: Family Medicine

## 2019-12-22 ENCOUNTER — Ambulatory Visit (INDEPENDENT_AMBULATORY_CARE_PROVIDER_SITE_OTHER): Payer: Medicare Other

## 2019-12-22 ENCOUNTER — Other Ambulatory Visit: Payer: Self-pay

## 2019-12-22 DIAGNOSIS — Z1231 Encounter for screening mammogram for malignant neoplasm of breast: Secondary | ICD-10-CM

## 2020-03-07 ENCOUNTER — Other Ambulatory Visit: Payer: Self-pay | Admitting: Family Medicine

## 2020-03-07 NOTE — Telephone Encounter (Signed)
Patient need to schedule an ov for more refills.  PT is due for CPE 02/2020.

## 2020-03-31 ENCOUNTER — Other Ambulatory Visit: Payer: Self-pay

## 2020-04-04 ENCOUNTER — Encounter: Payer: Self-pay | Admitting: Family Medicine

## 2020-04-04 ENCOUNTER — Other Ambulatory Visit: Payer: Self-pay

## 2020-04-04 ENCOUNTER — Ambulatory Visit (INDEPENDENT_AMBULATORY_CARE_PROVIDER_SITE_OTHER): Payer: Medicare Other | Admitting: Family Medicine

## 2020-04-04 VITALS — BP 124/75 | HR 73 | Temp 98.2°F | Resp 16 | Ht 62.0 in | Wt 163.8 lb

## 2020-04-04 DIAGNOSIS — K219 Gastro-esophageal reflux disease without esophagitis: Secondary | ICD-10-CM

## 2020-04-04 DIAGNOSIS — R7303 Prediabetes: Secondary | ICD-10-CM

## 2020-04-04 DIAGNOSIS — I10 Essential (primary) hypertension: Secondary | ICD-10-CM

## 2020-04-04 DIAGNOSIS — N183 Chronic kidney disease, stage 3 unspecified: Secondary | ICD-10-CM

## 2020-04-04 LAB — BASIC METABOLIC PANEL
BUN: 16 mg/dL (ref 6–23)
CO2: 27 mEq/L (ref 19–32)
Calcium: 9.6 mg/dL (ref 8.4–10.5)
Chloride: 102 mEq/L (ref 96–112)
Creatinine, Ser: 0.94 mg/dL (ref 0.40–1.20)
GFR: 58.91 mL/min — ABNORMAL LOW (ref >60.00)
Glucose, Bld: 89 mg/dL (ref 70–99)
Potassium: 4.8 mEq/L (ref 3.5–5.1)
Sodium: 136 mEq/L (ref 135–145)

## 2020-04-04 LAB — HEMOGLOBIN A1C: Hgb A1c MFr Bld: 6.1 % (ref 4.6–6.5)

## 2020-04-04 MED ORDER — PANTOPRAZOLE SODIUM 40 MG PO TBEC: 40.0000 mg | DELAYED_RELEASE_TABLET | Freq: Every day | ORAL | 3 refills | Status: AC

## 2020-04-04 MED ORDER — IRBESARTAN 150 MG PO TABS: 150.0000 mg | ORAL_TABLET | Freq: Every day | ORAL | 3 refills | Status: AC

## 2020-04-04 NOTE — Progress Notes (Signed)
OFFICE VISIT  04/04/2020   CC:  Chief Complaint  Patient presents with  . Follow-up    RCI, pt is    HPI:    Patient is a 69 y.o. Caucasian female who presents for 7 mo f/u HTN, prediabetes, and CRI II/III (GFR 55 ml/min). Last visit her bp was well controlled after having increased her irbesartan to 150mg  qd. All lab work was stable 7 mo ago.  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  HTN: compliant with irbesartan.  No home bp measurements. Has felt well. Trying to improve activity level: she is definitely not sedentary.  Not so much dietary change.  Wt is stable.   She avoids NSAIDs, tries to hydrate well. Trying to take deep breaths better--she is high strung. Occ sharp ache in lower chest, mid epigastric area.  On and off during some days. No exertional induced CP, no chest pressure, no SOB, no diaphoresis, no arm pain or jaw pain.  ROS: no fevers, no SOB, no wheezing, no cough, no dizziness, no HAs, no rashes, no melena/hematochezia.  No polyuria or polydipsia.  No myalgias or arthralgias.  No focal weakness, paresthesias, or tremors.  No acute vision or hearing abnormalities. No n/v/d or abd pain.  No palpitations.      Past Medical History:  Diagnosis Date  . Allergic rhinitis   . Anxiety   . Asymptomatic gallstones 11/2017   Noted on KUB  . Basal cell carcinoma    left arm  . Chronic renal insufficiency, stage 2 (mild) 2018   Stage II/III: GFR high 50s-low 60s  . Diverticulosis   . GERD (gastroesophageal reflux disease)   . History of chest pain    Normal ETT 2012  . Hypertension   . Lipoma of neck    R post cerv area  . Posterior vitreous detachment of right eye 2017   Dr. Larose Kells  . Prediabetes 01/2017   Hb A1c 6%. Same 08/2019  . Right knee DJD 01/2014   per old records, x-ray showed mild tricompartmental DJD with large joint effusion.  . Seasonal allergies   . Trigger middle finger of left hand 01/2016   Dr. Amedeo Plenty  aspirated/injected this    Past Surgical History:  Procedure Laterality Date  . CATARACT EXTRACTION W/ INTRAOCULAR LENS IMPLANT Right 09/17/2015  . [place]  . COLONOSCOPY  11/07/05; 11/2015   2017 tubular adenoma: recall 5 yrs  . DEXA  08/05/11   Normal  . DEXA  09/03/2017   T score -1.5.  Rpt 2 yrs.  Marland Kitchen ETT  07/12/11   Normal (at [facility]) per old records  . EYE MUSCLE SURGERY Bilateral 1965  . PILONIDAL CYST EXCISION  1975    Outpatient Medications Prior to Visit  Medication Sig Dispense Refill  . Cholecalciferol (VITAMIN D3 PO) Take 50 mcg by mouth.    . Multiple Vitamins-Minerals (HAIR/SKIN/NAILS/BIOTIN PO) Take by mouth.    . Multiple Vitamins-Minerals (MULTIVITAMIN GUMMIES ADULT PO) Take by mouth.    . irbesartan (AVAPRO) 150 MG tablet TAKE 1 TABLET BY MOUTH EVERY DAY 30 tablet 0  . pantoprazole (PROTONIX) 40 MG tablet TAKE 1 TABLET BY MOUTH EVERY DAY 90 tablet 1  . Aspirin-Caffeine (BACK & BODY EXTRA STRENGTH PO) Take by mouth. Reported on 01/22/2016 (Patient not taking: Reported on 04/04/2020)    . calcium carbonate (TUMS - DOSED IN MG ELEMENTAL CALCIUM) 500 MG chewable tablet Chew 1 tablet by mouth daily. (Patient not taking:  Reported on 04/04/2020)    . hydrocortisone (ANUSOL-HC) 2.5 % rectal cream Place 1 application rectally 2 (two) times daily. (Patient not taking: Reported on 07/07/2019) 30 g 1   No facility-administered medications prior to visit.    Allergies  Allergen Reactions  . Tetracyclines & Related Rash    ROS As per HPI  PE: Vitals with BMI 04/04/2020 09/08/2019 08/25/2019  Height 5\' 2"  - 5\' 2"   Weight 163 lbs 13 oz 161 lbs 10 oz 167 lbs 6 oz  BMI 29.95 96.75 91.63  Systolic 846 659 935  Diastolic 75 88 89  Pulse 73 74 69  O2 sat on RA today is 95%  Gen: Alert, well appearing.  Patient is oriented to person, place, time, and situation. AFFECT: pleasant, lucid thought and speech. CV: RRR, no m/r/g.   LUNGS: CTA bilat, nonlabored  resps, good aeration in all lung fields. EXT: no clubbing or cyanosis.  no edema.    LABS:  Lab Results  Component Value Date   TSH 0.81 01/22/2017   Lab Results  Component Value Date   WBC 6.6 12/05/2017   HGB 15.0 12/05/2017   HCT 44.1 12/05/2017   MCV 87.4 12/05/2017   PLT 360.0 12/05/2017   Lab Results  Component Value Date   CREATININE 1.01 08/25/2019   BUN 16 08/25/2019   NA 138 08/25/2019   K 4.7 08/25/2019   CL 103 08/25/2019   CO2 27 08/25/2019   Lab Results  Component Value Date   ALT 21 08/25/2019   AST 18 08/25/2019   ALKPHOS 69 08/25/2019   BILITOT 0.9 08/25/2019   Lab Results  Component Value Date   CHOL 161 08/25/2019   Lab Results  Component Value Date   HDL 40.40 08/25/2019   Lab Results  Component Value Date   LDLCALC 96 08/25/2019   Lab Results  Component Value Date   TRIG 127.0 08/25/2019   Lab Results  Component Value Date   CHOLHDL 4 08/25/2019   Lab Results  Component Value Date   HGBA1C 6.0 08/25/2019    IMPRESSION AND PLAN:  1) HTN: The current medical regimen is effective;  continue present plan and medications. RF'd irbesartan 150mg  qd, #90, RF x 3. Encouraged pt to try to monitor bp at home on occasion. Lytes/cr today.  2) Prediabetes. Work on exercise. Hba1c today.  3) CRI II/III: avoiding NSAIDs.  Cont good hydration habits. Lytes/cr today.  4) GERD: stable on daily pantoprazole 40mg . Rx renewed today.  An After Visit Summary was printed and given to the patient.  FOLLOW UP: Return in about 6 months (around 10/05/2020) for routine chronic illness f/u.  Signed:  Crissie Sickles, MD           04/04/2020

## 2020-05-30 ENCOUNTER — Ambulatory Visit: Payer: Medicare Other

## 2020-06-06 NOTE — Progress Notes (Signed)
Subjective:   Kelly Barnett is a 69 y.o. female who presents for Medicare Annual (Subsequent) preventive examination.   I connected with Kelly Barnett today by telephone and verified that I am speaking with the correct person using two identifiers. Location patient: home Location provider: work Persons participating in the virtual visit: patient, Marine scientist.    I discussed the limitations, risks, security and privacy concerns of performing an evaluation and management service by telephone and the availability of in person appointments. I also discussed with the patient that there may be a patient responsible charge related to this service. The patient expressed understanding and verbally consented to this telephonic visit.    Interactive audio and video telecommunications were attempted between this provider and patient, however failed, due to patient having technical difficulties OR patient did not have access to video capability.  We continued and completed visit with audio only.  Some vital signs may be absent or patient reported.   Time Spent with patient on telephone encounter: 30 minutes  Review of Systems     Cardiac Risk Factors include: advanced age (>76men, >71 women);hypertension;sedentary lifestyle     Objective:    Today's Vitals   06/07/20 1458  Weight: 159 lb (72.1 kg)  Height: 5\' 2"  (1.575 m)   Body mass index is 29.08 kg/m.  Advanced Directives 06/07/2020 08/07/2018 08/05/2017 11/16/2015  Does Patient Have a Medical Advance Directive? No No No No  Would patient like information on creating a medical advance directive? No - Patient declined No - Patient declined No - Patient declined -    Current Medications (verified) Outpatient Encounter Medications as of 06/07/2020  Medication Sig  . Aspirin-Caffeine (BACK & BODY EXTRA STRENGTH PO) Take by mouth. Reported on 01/22/2016  . calcium carbonate (TUMS - DOSED IN MG ELEMENTAL CALCIUM) 500 MG chewable tablet Chew 1 tablet by  mouth daily.   . hydrocortisone (ANUSOL-HC) 2.5 % rectal cream Place 1 application rectally 2 (two) times daily.  . irbesartan (AVAPRO) 150 MG tablet Take 1 tablet (150 mg total) by mouth daily.  . Multiple Vitamins-Minerals (HAIR/SKIN/NAILS/BIOTIN PO) Take by mouth.  . Multiple Vitamins-Minerals (MULTIVITAMIN GUMMIES ADULT PO) Take by mouth.  . pantoprazole (PROTONIX) 40 MG tablet Take 1 tablet (40 mg total) by mouth daily.  . Cholecalciferol (VITAMIN D3 PO) Take 50 mcg by mouth.   No facility-administered encounter medications on file as of 06/07/2020.    Allergies (verified) Tetracyclines & related   History: Past Medical History:  Diagnosis Date  . Allergic rhinitis   . Anxiety   . Asymptomatic gallstones 11/2017   Noted on KUB  . Basal cell carcinoma    left arm  . Chronic renal insufficiency, stage 2 (mild) 2018   Stage II/III: GFR high 50s-low 60s  . Diverticulosis   . GERD (gastroesophageal reflux disease)   . History of chest pain    Normal ETT 2012  . Hypertension   . Lipoma of neck    R post cerv area  . Posterior vitreous detachment of right eye 2017   Dr. Larose Kells  . Prediabetes 01/2017   Hb A1c 6%. Same 08/2019 and 03/2020.  . Right knee DJD 01/2014   per old records, x-ray showed mild tricompartmental DJD with large joint effusion.  . Seasonal allergies   . Trigger middle finger of left hand 01/2016   Dr. Amedeo Plenty aspirated/injected this   Past Surgical History:  Procedure Laterality Date  . CATARACT EXTRACTION W/ INTRAOCULAR LENS IMPLANT Right 09/17/2015  .  [place]  . COLONOSCOPY  11/07/05; 11/2015   2017 tubular adenoma: recall 5 yrs  . DEXA  08/05/11   Normal  . DEXA  09/03/2017   T score -1.5.  Rpt 2 yrs.  Marland Kitchen ETT  07/12/11   [facility] (at [facility] per old records  . EYE MUSCLE SURGERY Bilateral 1965  . PILONIDAL CYST EXCISION  1975   Family History  Problem Relation Age of Onset  . Arthritis Mother   . Alcohol abuse Father   .  Early death Father        ruptured blood vessel in throat  . Lung cancer Maternal Uncle   . Arthritis Maternal Grandmother   . Diabetes Maternal Grandmother   . Prostate cancer Maternal Grandfather   . Leukemia Other   . Colon cancer Neg Hx   . Esophageal cancer Neg Hx   . Rectal cancer Neg Hx   . Stomach cancer Neg Hx    Social History   Socioeconomic History  . Marital status: Single    Spouse name: Not on file  . Number of children: Not on file  . Years of education: Not on file  . Highest education level: Not on file  Occupational History  . Not on file  Tobacco Use  . Smoking status: Never Smoker  . Smokeless tobacco: Never Used  Substance and Sexual Activity  . Alcohol use: Yes    Alcohol/week: 0.0 standard drinks    Comment: rare alcohol intake  . Drug use: No  . Sexual activity: Not on file  Other Topics Concern  . Not on file  Social History Narrative   Single, moved to [place] from [place] in 2014 to be closer to her son and grandchildren.   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   Occ alcohol.   No tobacco or drugs.   Active in "play" but no formal exercise regimen.   Social Determinants of Health   Financial Resource Strain: Low Risk   . Difficulty of Paying Living Expenses: Not hard at all  Food Insecurity: No Food Insecurity  . Worried About Charity fundraiser in the Last Year: Never true  . Ran Out of Food in the Last Year: Never true  Transportation Needs: No Transportation Needs  . Lack of Transportation (Medical): No  . Lack of Transportation (Non-Medical): No  Physical Activity: Inactive  . Days of Exercise per Week: 0 days  . Minutes of Exercise per Session: 0 min  Stress: No Stress Concern Present  . Feeling of Stress : Not at all  Social Connections: Socially Isolated  . Frequency of Communication with Friends and Family: More than three times a week  . Frequency of Social Gatherings with Friends  and Family: More than three times a week  . Attends Religious Services: Never  . Active Member of Clubs or Organizations: No  . Attends Archivist Meetings: Never  . Marital Status: Divorced    Tobacco Counseling Counseling given: Not Answered   Clinical Intake:  Pre-visit preparation completed: Yes  Pain : No/denies pain     Nutritional Status: BMI 25 -29 Overweight Nutritional Risks: None Diabetes: No  How often do you need to have someone help you when you read instructions, pamphlets, or other written materials from your doctor or pharmacy?: 1 - Never What is the last grade level you completed in school?: 12th grade  Diabetic?No Interpreter Needed?: No  Information entered by :: Caroleen Hamman LPN   Activities of Daily Living In your present state of health, do you have any difficulty performing the following activities: 06/07/2020  Hearing? N  Vision? N  Difficulty concentrating or making decisions? N  Walking or climbing stairs? N  Dressing or bathing? N  Doing errands, shopping? N  Preparing Food and eating ? N  Using the Toilet? N  In the past six months, have you accidently leaked urine? N  Do you have problems with loss of bowel control? N  Managing your Medications? N  Managing your Finances? N  Housekeeping or managing your Housekeeping? N  Some recent data might be hidden    Patient Care Team: Tammi Sou, MD as PCP - General (Family Medicine) McGowen, Adrian Blackwater, MD (Family Medicine) Druscilla Brownie, MD as Consulting Physician (Dermatology) Roseanne Kaufman, MD as Consulting Physician (Orthopedic Surgery) Leonia Corona, MD as Consulting Physician (Ophthalmology)  Indicate any recent [place] you may have received from other than [facility] providers in the past year (date may be approximate).     Assessment:   This is a routine wellness examination for Ivi.  Hearing/Vision screen  Hearing Screening   125Hz  250Hz  500Hz   1000Hz  2000Hz  3000Hz  4000Hz  6000Hz  8000Hz   Right ear:           Left ear:           Comments: No issues  Vision Screening Comments: Last eye exam-2021- [facility]  Dietary issues and exercise activities discussed: Current Exercise Habits: The patient does not participate in regular exercise at present, Exercise limited by: None identified  Goals      Patient Stated   .  Patient Stated (pt-stated)      Maintain current health.      Other   .  Weight (lb) < 155 lb (70.3 kg)      Lose weight by decreasing caloric intake and increasing activity.       Depression Screen PHQ 2/9 Scores 06/07/2020 08/25/2019 08/07/2018 08/05/2017 08/09/2015  PHQ - 2 Score 1 1 0 0 0    Fall Risk Fall Risk  06/07/2020 08/25/2019 08/07/2018 08/05/2017 08/09/2015  Falls in the past year? 0 0 0 No No  Number falls in past yr: 0 0 - - -  Injury with Fall? 0 0 - - -  Follow up Falls prevention discussed Falls evaluation completed - - -    Any stairs in or around the home? No  Home free of loose throw rugs in walkways, pet beds, electrical cords, etc? Yes  Adequate lighting in your home to reduce risk of falls? Yes   ASSISTIVE DEVICES UTILIZED TO PREVENT FALLS:  Life alert? No  Use of a cane, walker or w/c? No  Grab bars in the bathroom? Yes  Shower chair or bench in shower? No  Elevated toilet seat or a handicapped toilet? No   TIMED UP AND GO:  Was the test performed? No . Phone visit   Cognitive Function:No cognitive impairment noted.Patient plays games on her computer for brain health.  MMSE - Mini Mental State Exam 08/07/2018 08/05/2017  Orientation to time 5 5  Orientation to Place 5 5  Registration 3 3  Attention/ Calculation 5 5  Recall 3 2  Language- name 2 objects 2 2  Language- repeat 1 1  Language- follow 3 step command 3 3  Language- read & follow direction 1 1  Write a sentence 1 1  Copy  design 1 1  Total score 30 29        Immunizations Immunization History    Administered Date(s) Administered  . Tdap 07/31/2011    TDAP status: Up to date   Flu Vaccine status: Declined, Education has been provided regarding the importance of this vaccine but patient still declined. Advised may receive this vaccine at local pharmacy or Health Dept. Aware to provide a copy of the vaccination record if obtained from local pharmacy or Health Dept. Verbalized acceptance and understanding.   Pneumococcal vaccine status: Declined,  Education has been provided regarding the importance of this vaccine but patient still declined. Advised may receive this vaccine at local pharmacy or Health Dept. Aware to provide a copy of the vaccination record if obtained from local pharmacy or Health Dept. Verbalized acceptance and understanding.    Covid-19 vaccine status: Declined, Education has been provided regarding the importance of this vaccine but patient still declined. Advised may receive this vaccine at local pharmacy or Health Dept.or vaccine clinic. Aware to provide a copy of the vaccination record if obtained from local pharmacy or Health Dept. Verbalized acceptance and understanding.  Qualifies for Shingles Vaccine? Declined  Screening Tests Health Maintenance  Topic Date Due  . COVID-19 Vaccine (1) 06/23/2020 (Originally 07/03/1963)  . INFLUENZA VACCINE  12/14/2020 (Originally 04/16/2020)  . PNA vac Low Risk Adult (1 of 2 - PCV13) 06/07/2021 (Originally 07/04/2015)  . COLONOSCOPY  11/29/2020  . MAMMOGRAM  12/21/2020  . TETANUS/TDAP  07/30/2021  . DEXA SCAN  Completed  . Hepatitis C Screening  Completed    Health Maintenance  There are no preventive care reminders to display for this patient.  Colorectal cancer screening: Completed 11/30/2015. Repeat every 5 years   Mammogram status: Completed 12/22/2019. Repeat every year   Bone Density Status: Due- Patient plans to have done with mammogram next April.  Lung Cancer Screening: (Low Dose CT Chest recommended if Age  72-80 years, 30 pack-year currently smoking OR have quit w/in 15years.) does not qualify.    Additional Screening:  Hepatitis C Screening:  Completed 01/22/2017  Vision Screening: Recommended annual ophthalmology exams for early detection of glaucoma and other disorders of the eye. Is the patient up to date with their annual eye exam?  Yes  Who is the provider or what is the name of the office in which the patient attends annual eye exams? [facility]  Dental Screening: Recommended annual dental exams for proper oral hygiene  Community Resource Referral / Chronic Care Management: CRR required this visit?  No   CCM required this visit?  No      Plan:     I have personally reviewed and noted the following in the patient's chart:   . Medical and social history . Use of alcohol, tobacco or illicit drugs  . Current medications and supplements . Functional ability and status . Nutritional status . Physical activity . Advanced directives . List of other physicians . Hospitalizations, surgeries, and ER visits in previous 12 months . Vitals . Screenings to include cognitive, depression, and falls . Referrals and appointments  In addition, I have reviewed and discussed with patient certain preventive protocols, quality metrics, and best practice recommendations. A written personalized care plan for preventive services as well as general preventive health recommendations were provided to patient.    Due to this being a telephonic visit, the after visit summary with patients personalized plan was offered to patient via mail or my-chart.  Patient would  like to access on my-chart.   Marta Antu, LPN   2/00/4159  Nurse Health Advisor  Nurse Notes: None

## 2020-06-07 ENCOUNTER — Ambulatory Visit (INDEPENDENT_AMBULATORY_CARE_PROVIDER_SITE_OTHER): Payer: Medicare Other

## 2020-06-07 VITALS — Ht 62.0 in | Wt 159.0 lb

## 2020-06-07 DIAGNOSIS — Z0000 Encounter for general adult medical examination without abnormal findings: Secondary | ICD-10-CM

## 2020-06-07 NOTE — Patient Instructions (Signed)
Kelly Barnett , Thank you for taking time to complete your Medicare Wellness Visit. I appreciate your ongoing commitment to your health goals. Please review the following plan we discussed and let me know if I can assist you in the future.   Screening recommendations/referrals: Colonoscopy: Completed 11/30/2015-Due 11/29/2020 Mammogram: Completed 12/22/2019- Due 12/21/2020 Bone Density: Due- Per our conversation today, to be scheduled with mammogram next April. Recommended yearly ophthalmology/optometry visit for glaucoma screening and checkup Recommended yearly dental visit for hygiene and checkup  Vaccinations: Influenza vaccine: Declined Pneumococcal vaccine: Declined Tdap vaccine: Up to Date-Due-07/30/2021 Shingles vaccine: Declined  Covid-19:Declined  Advanced directives: Please bring a copy for your chart when available  Conditions/risks identified: See problem list  Next appointment: Follow up in one year for your annual wellness visit 06/13/2021 @ 3:45pm   Preventive Care 70 Years and Older, Female Preventive care refers to lifestyle choices and visits with your health care provider that can promote health and wellness. What does preventive care include?  A yearly physical exam. This is also called an annual well check.  Dental exams once or twice a year.  Routine eye exams. Ask your health care provider how often you should have your eyes checked.  Personal lifestyle choices, including:  Daily care of your teeth and gums.  Regular physical activity.  Eating a healthy diet.  Avoiding tobacco and drug use.  Limiting alcohol use.  Practicing safe sex.  Taking low-dose aspirin every day.  Taking vitamin and mineral supplements as recommended by your health care provider. What happens during an annual well check? The services and screenings done by your health care provider during your annual well check will depend on your age, overall health, lifestyle risk factors, and  family history of disease. Counseling  Your health care provider may ask you questions about your:  Alcohol use.  Tobacco use.  Drug use.  Emotional well-being.  Home and relationship well-being.  Sexual activity.  Eating habits.  History of falls.  Memory and ability to understand (cognition).  Work and work Statistician.  Reproductive health. Screening  You may have the following tests or measurements:  Height, weight, and BMI.  Blood pressure.  Lipid and cholesterol levels. These may be checked every 5 years, or more frequently if you are over 10 years old.  Skin check.  Lung cancer screening. You may have this screening every year starting at age 23 if you have a 30-pack-year history of smoking and currently smoke or have quit within the past 15 years.  Fecal occult blood test (FOBT) of the stool. You may have this test every year starting at age 64.  Flexible sigmoidoscopy or colonoscopy. You may have a sigmoidoscopy every 5 years or a colonoscopy every 10 years starting at age 53.  Hepatitis C blood test.  Hepatitis B blood test.  Sexually transmitted disease (STD) testing.  Diabetes screening. This is done by checking your blood sugar (glucose) after you have not eaten for a while (fasting). You may have this done every 1-3 years.  Bone density scan. This is done to screen for osteoporosis. You may have this done starting at age 75.  Mammogram. This may be done every 1-2 years. Talk to your health care provider about how often you should have regular mammograms. Talk with your health care provider about your test results, treatment options, and if necessary, the need for more tests. Vaccines  Your health care provider may recommend certain vaccines, such as:  Influenza vaccine. This  is recommended every year.  Tetanus, diphtheria, and acellular pertussis (Tdap, Td) vaccine. You may need a Td booster every 10 years.  Zoster vaccine. You may need this  after age 25.  Pneumococcal 13-valent conjugate (PCV13) vaccine. One dose is recommended after age 54.  Pneumococcal polysaccharide (PPSV23) vaccine. One dose is recommended after age 69. Talk to your health care provider about which screenings and vaccines you need and how often you need them. This information is not intended to replace advice given to you by your health care provider. Make sure you discuss any questions you have with your health care provider. Document Released: 09/29/2015 Document Revised: 05/22/2016 Document Reviewed: 07/04/2015 Elsevier Interactive Patient Education  2017 [place] Prevention in the Home Falls can cause injuries. They can happen to people of all ages. There are many things you can do to make your home safe and to help prevent falls. What can I do on the outside of my home?  Regularly fix the edges of walkways and driveways and fix any cracks.  Remove anything that might make you trip as you walk through a door, such as a raised step or threshold.  Trim any bushes or trees on the path to your home.  Use bright outdoor lighting.  Clear any walking paths of anything that might make someone trip, such as rocks or tools.  Regularly check to see if handrails are loose or broken. Make sure that both sides of any steps have handrails.  Any raised decks and porches should have guardrails on the edges.  Have any leaves, snow, or ice cleared regularly.  Use sand or salt on walking paths during winter.  Clean up any spills in your garage right away. This includes oil or grease spills. What can I do in the bathroom?  Use night lights.  Install grab bars by the toilet and in the tub and shower. Do not use towel bars as grab bars.  Use non-skid mats or decals in the tub or shower.  If you need to sit down in the shower, use a plastic, non-slip stool.  Keep the floor dry. Clean up any water that spills on the floor as soon as it  happens.  Remove soap buildup in the tub or shower regularly.  Attach bath mats securely with double-sided non-slip rug tape.  Do not have throw rugs and other things on the floor that can make you trip. What can I do in the bedroom?  Use night lights.  Make sure that you have a light by your bed that is easy to reach.  Do not use any sheets or blankets that are too big for your bed. They should not hang down onto the floor.  Have a firm chair that has side arms. You can use this for support while you get dressed.  Do not have throw rugs and other things on the floor that can make you trip. What can I do in the kitchen?  Clean up any spills right away.  Avoid walking on wet floors.  Keep items that you use a lot in easy-to-reach places.  If you need to reach something above you, use a strong step stool that has a grab bar.  Keep electrical cords out of the way.  Do not use floor polish or wax that makes floors slippery. If you must use wax, use non-skid floor wax.  Do not have throw rugs and other things on the floor that can make you  trip. What can I do with my stairs?  Do not leave any items on the stairs.  Make sure that there are handrails on both sides of the stairs and use them. Fix handrails that are broken or loose. Make sure that handrails are as long as the stairways.  Check any carpeting to make sure that it is firmly attached to the stairs. Fix any carpet that is loose or worn.  Avoid having throw rugs at the top or bottom of the stairs. If you do have throw rugs, attach them to the floor with carpet tape.  Make sure that you have a light switch at the top of the stairs and the bottom of the stairs. If you do not have them, ask someone to add them for you. What else can I do to help prevent falls?  Wear shoes that:  Do not have high heels.  Have rubber bottoms.  Are comfortable and fit you well.  Are closed at the toe. Do not wear sandals.  If you  use a stepladder:  Make sure that it is fully opened. Do not climb a closed stepladder.  Make sure that both sides of the stepladder are locked into place.  Ask someone to hold it for you, if possible.  Clearly mark and make sure that you can see:  Any grab bars or handrails.  First and last steps.  Where the edge of each step is.  Use tools that help you move around (mobility aids) if they are needed. These include:  Canes.  Walkers.  Scooters.  Crutches.  Turn on the lights when you go into a dark area. Replace any light bulbs as soon as they burn out.  Set up your furniture so you have a clear path. Avoid moving your furniture around.  If any of your floors are uneven, fix them.  If there are any pets around you, be aware of where they are.  Review your medicines with your doctor. Some medicines can make you feel dizzy. This can increase your chance of falling. Ask your doctor what other things that you can do to help prevent falls. This information is not intended to replace advice given to you by your health care provider. Make sure you discuss any questions you have with your health care provider. Document Released: 06/29/2009 Document Revised: 02/08/2016 Document Reviewed: 10/07/2014 Elsevier Interactive Patient Education  2017 [employer].

## 2020-08-14 ENCOUNTER — Telehealth: Payer: Self-pay

## 2020-08-14 NOTE — Telephone Encounter (Signed)
Kelly Barnett with [facility] is calling requesting refills to be sent per patient request.  [name] recently set up mail order program with them.  irbesartan (AVAPRO) 150 MG tablet [[id number]]   pantoprazole (PROTONIX) 40 MG tablet [[id number]]   Please update preferred pharmacy  [facility] [phone]

## 2020-09-21 ENCOUNTER — Telehealth: Payer: Self-pay | Admitting: Family Medicine

## 2020-09-21 NOTE — Telephone Encounter (Signed)
Spoke with patient and advised refills resubmitted this morning to [facility].

## 2020-09-21 NOTE — Telephone Encounter (Signed)
Patient states she has new Medicare coverage and needs refills of Irbesartan and Pantoprazole sent to [facility] mail order, fax [phone], phone [phone].

## 2020-09-25 ENCOUNTER — Other Ambulatory Visit: Payer: PRIVATE HEALTH INSURANCE

## 2020-09-25 DIAGNOSIS — Z20822 Contact with and (suspected) exposure to covid-19: Secondary | ICD-10-CM | POA: Diagnosis not present

## 2020-09-26 LAB — NOVEL CORONAVIRUS, NAA: SARS-CoV-2, NAA: NOT DETECTED

## 2020-09-26 LAB — SARS-COV-2, NAA 2 DAY TAT

## 2020-09-27 ENCOUNTER — Encounter: Payer: Self-pay | Admitting: Family Medicine

## 2020-09-27 ENCOUNTER — Other Ambulatory Visit: Payer: Self-pay

## 2020-09-27 ENCOUNTER — Telehealth (INDEPENDENT_AMBULATORY_CARE_PROVIDER_SITE_OTHER): Payer: Medicare HMO | Admitting: Family Medicine

## 2020-09-27 DIAGNOSIS — J069 Acute upper respiratory infection, unspecified: Secondary | ICD-10-CM

## 2020-09-27 MED ORDER — LORATADINE 10 MG PO TABS: 10.0000 mg | ORAL_TABLET | Freq: Every day | ORAL | 11 refills | Status: DC

## 2020-09-27 MED ORDER — FLUTICASONE PROPIONATE 50 MCG/ACT NA SUSP: 2.0000 | Freq: Every day | NASAL | 6 refills | Status: DC

## 2020-09-27 NOTE — Progress Notes (Signed)
Virtual Visit via Video Note  I connected with Kelly Barnett on 09/27/20 at 11:40 AM EST by a video enabled telemedicine application and verified that I am speaking with the correct person using two identifiers.  Location: Patient: home Provider: home    I discussed the limitations of evaluation and management by telemedicine and the availability of in person appointments. The patient expressed understanding and agreed to proceed.  History of Present Illness: Pt is home c/o feeling lousy since NYE.   She had a covid test that was neg. Still having headaches and body aches  No fevers  Observations/Objective: There were no vitals filed for this visit. 99.3 several days ago  Has been normal  . Assessment and Plan: 1. Viral upper respiratory tract infection Use flonase and claritin F/u with pcp --  covid test was neg  - fluticasone (FLONASE) 50 MCG/ACT nasal spray; Place 2 sprays into both nostrils daily.  Dispense: 16 g; Refill: 6 - loratadine (CLARITIN) 10 MG tablet; Take 1 tablet (10 mg total) by mouth daily.  Dispense: 30 tablet; Refill: 11   Follow Up Instructions:    I discussed the assessment and treatment plan with the patient. The patient was provided an opportunity to ask questions and all were answered. The patient agreed with the plan and demonstrated an understanding of the instructions.   The patient was advised to call back or seek an in-person evaluation if the symptoms worsen or if the condition fails to improve as anticipated.  I provided 25 minutes of non-face-to-face time during this encounter.   Ann Held, DO

## 2020-09-28 ENCOUNTER — Ambulatory Visit: Payer: PRIVATE HEALTH INSURANCE | Admitting: Family Medicine

## 2020-09-28 ENCOUNTER — Other Ambulatory Visit: Payer: Self-pay

## 2020-09-28 ENCOUNTER — Telehealth: Payer: Self-pay | Admitting: Family Medicine

## 2020-09-28 NOTE — Progress Notes (Deleted)
CC: URI  HPI:  Patient is a 70 y.o. Caucasian female with HTN, prediabetes, and CRI II/III who presents for recent resp illness.  Patient was seen for video visit yesterday, Dr. Etter Barnett, dx'd with URI (covid test previously done and was neg), rx'd flonase and claritin.  Onset of illness was around 2 wks ago: ***  What has been going on?? Fatigue?? When did this start?  Duration Quality: Agg/All Ass. Sx: Fever, cough, sputum production, vision, facial edema, headache Sick contacts  Recent abx use? Allergies to meds, amox, penicillin??   What has she been taking to help?     PMH: Past Medical History:  Diagnosis Date  . Allergic rhinitis   . Anxiety   . Asymptomatic gallstones 11/2017   Noted on [facility]  . Basal cell carcinoma    left arm  . Chronic renal insufficiency, stage 2 (mild) 2018   Stage II/III: GFR high 50s-low 60s  . Diverticulosis   . GERD (gastroesophageal reflux disease)   . History of chest pain    Normal ETT 2012  . Hypertension   . Lipoma of neck    R post cerv area  . Posterior vitreous detachment of right eye 2017   Dr. Larose Barnett  . Prediabetes 01/2017   Hb A1c 6%. Same 08/2019 and 03/2020.  . Right knee DJD 01/2014   per old records, x-ray showed mild tricompartmental DJD with large joint effusion.  . Seasonal allergies   . Trigger middle finger of left hand 01/2016   Dr. Amedeo Barnett aspirated/injected this    M/A: Current Outpatient Medications on File Prior to Visit  Medication Sig Dispense Refill  . Aspirin-Caffeine (BACK & BODY EXTRA STRENGTH PO) Take by mouth. Reported on 01/22/2016    . calcium carbonate (TUMS - DOSED IN MG ELEMENTAL CALCIUM) 500 MG chewable tablet Chew 1 tablet by mouth daily.     . Cholecalciferol (VITAMIN D3 PO) Take 50 mcg by mouth.    . fluticasone (FLONASE) 50 MCG/ACT nasal spray Place 2 sprays into both nostrils daily. 16 g 6  . hydrocortisone (ANUSOL-HC) 2.5 % rectal cream Place 1 application rectally 2 (two) times daily.  30 g 1  . irbesartan (AVAPRO) 150 MG tablet Take 1 tablet (150 mg total) by mouth daily. 90 tablet 3  . loratadine (CLARITIN) 10 MG tablet Take 1 tablet (10 mg total) by mouth daily. 30 tablet 11  . Multiple Vitamins-Minerals (HAIR/SKIN/NAILS/BIOTIN PO) Take by mouth.    . Multiple Vitamins-Minerals (MULTIVITAMIN GUMMIES ADULT PO) Take by mouth.    . pantoprazole (PROTONIX) 40 MG tablet Take 1 tablet (40 mg total) by mouth daily. 90 tablet 3   No current facility-administered medications on file prior to visit.   Allergies  Allergen Reactions  . Tetracyclines & Related Rash    FH: Family History  Problem Relation Age of Onset  . Arthritis Mother   . Alcohol abuse Father   . Early death Father        ruptured blood vessel in throat  . Lung cancer Maternal Uncle   . Arthritis Maternal Grandmother   . Diabetes Maternal Grandmother   . Prostate cancer Maternal Grandfather   . Leukemia Other   . Colon cancer Neg Hx   . Esophageal cancer Neg Hx   . Rectal cancer Neg Hx   . Stomach cancer Neg Hx     SH: Social History   Socioeconomic History  . Marital status: Single    Spouse name: Not on file  .  Number of children: Not on file  . Years of education: Not on file  . Highest education level: Not on file  Occupational History  . Not on file  Tobacco Use  . Smoking status: Never Smoker  . Smokeless tobacco: Never Used  Substance and Sexual Activity  . Alcohol use: Yes    Alcohol/week: 0.0 standard drinks    Comment: rare alcohol intake  . Drug use: No  . Sexual activity: Not on file  Other Topics Concern  . Not on file  Social History Narrative   Single, moved to [place] from [place] in 2014 to be closer to her son and grandchildren.   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   Occ alcohol.   No tobacco or drugs.   Active in "play" but no formal exercise regimen.   Social Determinants of Health   Financial Resource Strain: Low  Risk   . Difficulty of Paying Living Expenses: Not hard at all  Food Insecurity: No Food Insecurity  . Worried About Charity fundraiser in the Last Year: Never true  . Ran Out of Food in the Last Year: Never true  Transportation Needs: No Transportation Needs  . Lack of Transportation (Medical): No  . Lack of Transportation (Non-Medical): No  Physical Activity: Inactive  . Days of Exercise per Week: 0 days  . Minutes of Exercise per Session: 0 min  Stress: No Stress Concern Present  . Feeling of Stress : Not at all  Social Connections: Socially Isolated  . Frequency of Communication with Friends and Family: More than three times a week  . Frequency of Social Gatherings with Friends and Family: More than three times a week  . Attends Religious Services: Never  . Active Member of Clubs or Organizations: No  . Attends Archivist Meetings: Never  . Marital Status: Divorced    ROS: ROS  PE: Vitals with BMI 06/07/2020 04/04/2020 09/08/2019  Height 5\' 2"  5\' 2"  -  Weight 159 lbs 163 lbs 13 oz 161 lbs 10 oz  BMI 29.07 [id number]  Systolic - [id number]  Diastolic - 75 88  Pulse - 73 74    Physical Exam  Labs: Recent Results (from the past 2160 hour(s))  Novel Coronavirus, NAA ([facility])     Status: None   Collection Time: 09/25/20  5:17 PM   Specimen: Nasopharyngeal(NP) swabs in vial transport medium   Nasopharynge  Result Value Ref Range   SARS-CoV-2, NAA Not Detected Not Detected    Comment: This nucleic acid amplification test was developed and its performance characteristics determined by [employer], [employer]. Nucleic acid amplification tests include RT-PCR and TMA. This test has not been FDA cleared or approved. This test has been authorized by FDA under an Emergency Use Authorization (EUA). This test is only authorized for the duration of time the declaration that circumstances exist justifying the authorization of the emergency use of in vitro diagnostic tests  for detection of SARS-CoV-2 virus and/or diagnosis of COVID-19 infection under section 564(b)(1) of the Act, 21 U.S.C. PT:2852782) (1), unless the authorization is terminated or revoked sooner. When diagnostic testing is negative, the possibility of a false negative result should be considered in the context of a patient's recent exposures and the presence of clinical signs and symptoms consistent with COVID-19. An individual without symptoms of COVID-19 and who is not shedding SARS-CoV-2 virus wo uld expect to have a negative (not detected) result in  this assay.   SARS-COV-2, NAA 2 DAY TAT     Status: None   Collection Time: 09/25/20  5:17 PM   Nasopharynge  Result Value Ref Range   SARS-CoV-2, NAA 2 DAY TAT Performed      A/P: In summary, Kelly Barnett is a 70 y.o. year old female with a past medical history of HTN, prediabetes, CRI II/III who presents to the clinic with *** week/day history of ***.   1) Upper respiratory infection: Was seen via video visit yesterday by Dr. Etter Barnett who, per note, diagnosed with viral URI to be managed with fluticasone nasal spray and claritin. Covid test negative. Today patient ***   Lab Orders  No laboratory test(s) ordered today     Follow Up:    Signed: Nanetta Batty, MS3

## 2020-09-28 NOTE — Telephone Encounter (Signed)
Patient was unable to be seen for her appt today due to new onset today of sore throat and recent fever, body aches, fatigue and headaches.  Her main concern today was continuing headaches. She had video appt yesterday with [place] who suggested she come in for blood tests and urine tests for her continuing headaches. Please call patient to advise how to handle these tests. Patient did not want to wait for another appt in a couple weeks.

## 2020-09-28 NOTE — Telephone Encounter (Signed)
LM for pt to return call regarding testing/headaches.

## 2020-09-28 NOTE — Progress Notes (Deleted)
OFFICE VISIT  09/28/2020  CC: No chief complaint on file.  HPI:    Patient is a 70 y.o. Caucasian female with HTN, prediabetes, and CRI II/III who presents for recent resp illness. Was seen for video visit yesterday, Dr. Etter Sjogren, dx'd with URI (covid test previously done and was neg), rx'd flonase and claritin.  Onset of illness was around 2 wks ago: ***  Past Medical History:  Diagnosis Date  . Allergic rhinitis   . Anxiety   . Asymptomatic gallstones 11/2017   Noted on [facility]  . Basal cell carcinoma    left arm  . Chronic renal insufficiency, stage 2 (mild) 2018   Stage II/III: GFR high 50s-low 60s  . Diverticulosis   . GERD (gastroesophageal reflux disease)   . History of chest pain    Normal ETT 2012  . Hypertension   . Lipoma of neck    R post cerv area  . Posterior vitreous detachment of right eye 2017   Dr. Larose Kells  . Prediabetes 01/2017   Hb A1c 6%. Same 08/2019 and 03/2020.  . Right knee DJD 01/2014   per old records, x-ray showed mild tricompartmental DJD with large joint effusion.  . Seasonal allergies   . Trigger middle finger of left hand 01/2016   Dr. Amedeo Plenty aspirated/injected this    Past Surgical History:  Procedure Laterality Date  . CATARACT EXTRACTION W/ INTRAOCULAR LENS IMPLANT Right 09/17/2015  . [place]  . COLONOSCOPY  11/07/05; 11/2015   2017 tubular adenoma: recall 5 yrs  . DEXA  08/05/11   Normal  . DEXA  09/03/2017   T score -1.5.  Rpt 2 yrs.  Marland Kitchen ETT  07/12/11   Normal (at [facility]) per old records  . EYE MUSCLE SURGERY Bilateral 1965  . PILONIDAL CYST EXCISION  1975    Outpatient Medications Prior to Visit  Medication Sig Dispense Refill  . Aspirin-Caffeine (BACK & BODY EXTRA STRENGTH PO) Take by mouth. Reported on 01/22/2016    . calcium carbonate (TUMS - DOSED IN MG ELEMENTAL CALCIUM) 500 MG chewable tablet Chew 1 tablet by mouth daily.     . Cholecalciferol (VITAMIN D3 PO) Take 50 mcg by mouth.    . fluticasone (FLONASE)  50 MCG/ACT nasal spray Place 2 sprays into both nostrils daily. 16 g 6  . hydrocortisone (ANUSOL-HC) 2.5 % rectal cream Place 1 application rectally 2 (two) times daily. 30 g 1  . irbesartan (AVAPRO) 150 MG tablet Take 1 tablet (150 mg total) by mouth daily. 90 tablet 3  . loratadine (CLARITIN) 10 MG tablet Take 1 tablet (10 mg total) by mouth daily. 30 tablet 11  . Multiple Vitamins-Minerals (HAIR/SKIN/NAILS/BIOTIN PO) Take by mouth.    . Multiple Vitamins-Minerals (MULTIVITAMIN GUMMIES ADULT PO) Take by mouth.    . pantoprazole (PROTONIX) 40 MG tablet Take 1 tablet (40 mg total) by mouth daily. 90 tablet 3   No facility-administered medications prior to visit.    Allergies  Allergen Reactions  . Tetracyclines & Related Rash    ROS As per HPI  PE: Vitals with BMI 06/07/2020 04/04/2020 09/08/2019  Height 5\' 2"  5\' 2"  -  Weight 159 lbs 163 lbs 13 oz 161 lbs 10 oz  BMI 29.07 00.86 76.19  Systolic - 509 326  Diastolic - 75 88  Pulse - 73 74     ***  LABS:    Chemistry      Component Value Date/Time   NA 136 04/04/2020  0924   K 4.8 04/04/2020 0924   CL 102 04/04/2020 0924   CO2 27 04/04/2020 0924   BUN 16 04/04/2020 0924   CREATININE 0.94 04/04/2020 0924      Component Value Date/Time   CALCIUM 9.6 04/04/2020 0924   ALKPHOS 69 08/25/2019 1026   AST 18 08/25/2019 1026   ALT 21 08/25/2019 1026   BILITOT 0.9 08/25/2019 1026     Lab Results  Component Value Date   HGBA1C 6.1 04/04/2020   Lab Results  Component Value Date   TSH 0.81 01/22/2017    IMPRESSION AND PLAN:  No problem-specific Assessment & Plan notes found for this encounter.   An After Visit Summary was printed and given to the patient.  FOLLOW UP: No follow-ups on file.  Signed:  Crissie Sickles, MD           09/28/2020

## 2021-01-24 ENCOUNTER — Other Ambulatory Visit: Payer: Self-pay | Admitting: Family Medicine

## 2021-01-24 DIAGNOSIS — Z1231 Encounter for screening mammogram for malignant neoplasm of breast: Secondary | ICD-10-CM

## 2021-02-02 DIAGNOSIS — H43813 Vitreous degeneration, bilateral: Secondary | ICD-10-CM | POA: Diagnosis not present

## 2021-02-02 DIAGNOSIS — Z961 Presence of intraocular lens: Secondary | ICD-10-CM | POA: Diagnosis not present

## 2021-02-02 DIAGNOSIS — H25012 Cortical age-related cataract, left eye: Secondary | ICD-10-CM | POA: Diagnosis not present

## 2021-02-07 ENCOUNTER — Ambulatory Visit (INDEPENDENT_AMBULATORY_CARE_PROVIDER_SITE_OTHER): Payer: Medicare HMO

## 2021-02-07 ENCOUNTER — Other Ambulatory Visit: Payer: Self-pay

## 2021-02-07 DIAGNOSIS — Z1231 Encounter for screening mammogram for malignant neoplasm of breast: Secondary | ICD-10-CM | POA: Diagnosis not present

## 2021-02-07 DIAGNOSIS — M1710 Unilateral primary osteoarthritis, unspecified knee: Secondary | ICD-10-CM | POA: Diagnosis not present

## 2021-02-07 DIAGNOSIS — Z13220 Encounter for screening for lipoid disorders: Secondary | ICD-10-CM | POA: Diagnosis not present

## 2021-02-07 DIAGNOSIS — Z1211 Encounter for screening for malignant neoplasm of colon: Secondary | ICD-10-CM | POA: Diagnosis not present

## 2021-02-07 DIAGNOSIS — Z86010 Personal history of colonic polyps: Secondary | ICD-10-CM | POA: Diagnosis not present

## 2021-02-07 DIAGNOSIS — I10 Essential (primary) hypertension: Secondary | ICD-10-CM | POA: Diagnosis not present

## 2021-03-02 ENCOUNTER — Encounter: Payer: Self-pay | Admitting: Internal Medicine

## 2021-05-18 ENCOUNTER — Other Ambulatory Visit: Payer: Self-pay

## 2021-05-18 ENCOUNTER — Ambulatory Visit (AMBULATORY_SURGERY_CENTER): Payer: Medicare HMO | Admitting: *Deleted

## 2021-05-18 VITALS — Ht 62.0 in | Wt 162.0 lb

## 2021-05-18 DIAGNOSIS — Z86010 Personal history of colonic polyps: Secondary | ICD-10-CM

## 2021-05-18 MED ORDER — NA SULFATE-K SULFATE-MG SULF 17.5-3.13-1.6 GM/177ML PO SOLN: 1.0000 | ORAL | 0 refills | Status: DC

## 2021-05-18 NOTE — Progress Notes (Signed)
Patient's pre-visit was done today over the phone with the patient due to COVID-19 pandemic. Name,DOB and address verified. Insurance verified. Patient denies any allergies to Eggs and Soy. Patient denies any problems with anesthesia/sedation. Patient denies taking diet pills or blood thinners. No home Oxygen. Packet of Prep instructions mailed to patient including a copy of a consent form-pt is aware. Patient understands to call us back with any questions or concerns. Patient is aware of our care-partner policy and Covid-19 safety protocol.   EMMI education assigned to the patient for the procedure, sent to MyChart.   

## 2021-05-25 ENCOUNTER — Encounter: Payer: Self-pay | Admitting: Internal Medicine

## 2021-06-04 ENCOUNTER — Other Ambulatory Visit: Payer: Self-pay

## 2021-06-04 ENCOUNTER — Ambulatory Visit (AMBULATORY_SURGERY_CENTER): Payer: Medicare HMO | Admitting: Internal Medicine

## 2021-06-04 ENCOUNTER — Encounter: Payer: Self-pay | Admitting: Internal Medicine

## 2021-06-04 VITALS — BP 128/74 | HR 78 | Temp 98.0°F | Resp 15 | Ht 62.0 in | Wt 162.0 lb

## 2021-06-04 DIAGNOSIS — Z86010 Personal history of colonic polyps: Secondary | ICD-10-CM | POA: Diagnosis not present

## 2021-06-04 DIAGNOSIS — D122 Benign neoplasm of ascending colon: Secondary | ICD-10-CM

## 2021-06-04 DIAGNOSIS — D120 Benign neoplasm of cecum: Secondary | ICD-10-CM

## 2021-06-04 DIAGNOSIS — D125 Benign neoplasm of sigmoid colon: Secondary | ICD-10-CM

## 2021-06-04 MED ORDER — SODIUM CHLORIDE 0.9 % IV SOLN: 500.0000 mL | Freq: Once | INTRAVENOUS | Status: AC

## 2021-06-04 NOTE — Progress Notes (Signed)
VS taken by Grant 

## 2021-06-04 NOTE — Progress Notes (Signed)
Pt's states no medical or surgical changes since previsit or office visit. 

## 2021-06-04 NOTE — Patient Instructions (Signed)
Read all of the handouts given to you by your recovery room nurse.  YOU HAD AN ENDOSCOPIC PROCEDURE TODAY AT THE [facility]:   Refer to the procedure report that was given to you for any specific questions about what was found during the examination.  If the procedure report does not answer your questions, please call your gastroenterologist to clarify.  If you requested that your care partner not be given the details of your procedure findings, then the procedure report has been included in a sealed envelope for you to review at your convenience later.  YOU SHOULD EXPECT: Some feelings of bloating in the abdomen. Passage of more gas than usual.  Walking can help get rid of the air that was put into your GI tract during the procedure and reduce the bloating. If you had a lower endoscopy (such as a colonoscopy or flexible sigmoidoscopy) you may notice spotting of blood in your stool or on the toilet paper. If you underwent a bowel prep for your procedure, you may not have a normal bowel movement for a few days.  Please Note:  You might notice some irritation and congestion in your nose or some drainage.  This is from the oxygen used during your procedure.  There is no need for concern and it should clear up in a day or so.  SYMPTOMS TO REPORT IMMEDIATELY:  Following lower endoscopy (colonoscopy or flexible sigmoidoscopy):  Excessive amounts of blood in the stool  Significant tenderness or worsening of abdominal pains  Swelling of the abdomen that is new, acute  Fever of 100F or higher   For urgent or emergent issues, a gastroenterologist can be reached at any hour by calling [phone]. Do not use MyChart messaging for urgent concerns.    DIET:  We do recommend a small meal at first, but then you may proceed to your regular diet.  Drink plenty of fluids but you should avoid alcoholic beverages for 24 hours. Try to increase the fiber in your diet, and drink plenty of  water.  ACTIVITY:  You should plan to take it easy for the rest of today and you should NOT DRIVE or use heavy machinery until tomorrow (because of the sedation medicines used during the test).    FOLLOW UP: Our staff will call the number listed on your records 48-72 hours following your procedure to check on you and address any questions or concerns that you may have regarding the information given to you following your procedure. If we do not reach you, we will leave a message.  We will attempt to reach you two times.  During this call, we will ask if you have developed any symptoms of COVID 19. If you develop any symptoms (ie: fever, flu-like symptoms, shortness of breath, cough etc.) before then, please call [phone].  If you test positive for Covid 19 in the 2 weeks post procedure, please call and report this information to us.    If any biopsies were taken you will be contacted by phone or by letter within the next 1-3 weeks.  Please call us at [phone] if you have not heard about the biopsies in 3 weeks.    SIGNATURES/CONFIDENTIALITY: You and/or your care partner have signed paperwork which will be entered into your electronic medical record.  These signatures attest to the fact that that the information above on your After Visit Summary has been reviewed and is understood.  Full responsibility of the confidentiality of this   discharge information lies with you and/or your care-partner.  

## 2021-06-04 NOTE — Progress Notes (Signed)
GASTROENTEROLOGY PROCEDURE H&P NOTE   Primary Care Physician: Corrington, Delsa Grana, MD    Reason for Procedure:  History of colon polyps  Plan:    Surveillance colonoscopy  Patient is appropriate for endoscopic procedure(s) in the ambulatory ([facility]) setting.  The nature of the procedure, as well as the risks, benefits, and alternatives were carefully and thoroughly reviewed with the patient. Ample time for discussion and questions allowed. The patient understood, was satisfied, and agreed to proceed.     HPI: Kelly Barnett is a 70 y.o. female who presents for surveillance colonoscopy.  Medical history as below.  Last colonoscopy March 2017.  Tolerated the prep.  No complaint today including recent chest pain or shortness of breath.  No abdominal pain.  Past Medical History:  Diagnosis Date   Allergic rhinitis    Anxiety    Asymptomatic gallstones 11/2017   Noted on [facility]   Basal cell carcinoma    left arm   Chronic renal insufficiency, stage 2 (mild) 2018   Stage II/III: GFR high 50s-low 60s   Diverticulosis    GERD (gastroesophageal reflux disease)    History of chest pain    Normal ETT 2012   Hypertension    Lipoma of neck    R post cerv area   Posterior vitreous detachment of right eye 2017   Dr. Larose Kells   Prediabetes 01/2017   Hb A1c 6%. Same 08/2019 and 03/2020.   Right knee DJD 01/2014   per old records, x-ray showed mild tricompartmental DJD with large joint effusion.   Seasonal allergies    Trigger middle finger of left hand 01/2016   Dr. Amedeo Plenty aspirated/injected this    Past Surgical History:  Procedure Laterality Date   CATARACT EXTRACTION W/ INTRAOCULAR LENS IMPLANT Right 09/17/2015   [facility]ARE[facility] [place]   COLONOSCOPY  11/07/05; 11/2015   2017 tubular adenoma: recall 5 yrs   DEXA  08/05/11   Normal   DEXA  09/03/2017   T score -1.5.  Rpt 2 yrs.   ETT  07/12/11   Normal (at [facility]) per old records   [place]    Prior to Admission medications   Medication Sig Start Date End Date Taking? Authorizing Provider  Ascorbic Acid (VITAMIN C PO) Take by mouth.   Yes [provider]  Calcium-Phosphorus-Vitamin D (CALCIUM GUMMIES PO) Take by mouth.   Yes [provider]  irbesartan (AVAPRO) 150 MG tablet Take 1 tablet (150 mg total) by mouth daily. 04/04/20  Yes McGowen, Adrian Blackwater, MD  Multiple Vitamins-Minerals (HAIR/SKIN/NAILS/BIOTIN PO) Take by mouth.   Yes [provider]  Multiple Vitamins-Minerals (MULTIVITAMIN GUMMIES ADULT PO) Take by mouth.   Yes [provider]  pantoprazole (PROTONIX) 40 MG tablet Take 1 tablet (40 mg total) by mouth daily. 04/04/20  Yes McGowen, Adrian Blackwater, MD  zinc gluconate 50 MG tablet Take 50 mg by mouth daily.   Yes [provider]  Aspirin-Caffeine (BACK & BODY EXTRA STRENGTH PO) Take by mouth. Reported on 01/22/2016    [provider]  calcium carbonate (TUMS - DOSED IN MG ELEMENTAL CALCIUM) 500 MG chewable tablet Chew 1 tablet by mouth daily.     [provider]  hydrocortisone (ANUSOL-HC) 2.5 % rectal cream Place 1 application rectally 2 (two) times daily. 12/25/17   McGowen, Adrian Blackwater, MD  POTASSIUM PO Take by mouth.    [provider]  Current Outpatient Medications  Medication Sig Dispense Refill   Ascorbic Acid (VITAMIN C PO) Take by mouth.     Calcium-Phosphorus-Vitamin D (CALCIUM GUMMIES PO) Take by mouth.     irbesartan (AVAPRO) 150 MG tablet Take 1 tablet (150 mg total) by mouth daily. 90 tablet 3   Multiple Vitamins-Minerals (HAIR/SKIN/NAILS/BIOTIN PO) Take by mouth.     Multiple Vitamins-Minerals (MULTIVITAMIN GUMMIES ADULT PO) Take by mouth.     pantoprazole (PROTONIX) 40 MG tablet Take 1 tablet (40 mg total) by mouth daily. 90 tablet 3   zinc gluconate 50 MG tablet Take 50 mg by mouth daily.     Aspirin-Caffeine (BACK & BODY EXTRA STRENGTH PO) Take by mouth.  Reported on 01/22/2016     calcium carbonate (TUMS - DOSED IN MG ELEMENTAL CALCIUM) 500 MG chewable tablet Chew 1 tablet by mouth daily.      hydrocortisone (ANUSOL-HC) 2.5 % rectal cream Place 1 application rectally 2 (two) times daily. 30 g 1   POTASSIUM PO Take by mouth.     Current Facility-Administered Medications  Medication Dose Route Frequency Provider Last Rate Last Admin   0.9 %  sodium chloride infusion  500 mL Intravenous Once Tupac Jeffus, Lajuan Lines, MD        Allergies as of 06/04/2021 - Review Complete 06/04/2021  Allergen Reaction Noted   Tetracyclines & related Rash 02/28/2015    Family History  Problem Relation Age of Onset   Arthritis Mother    Alcohol abuse Father    Early death Father        ruptured blood vessel in throat   Lung cancer Maternal Uncle    Arthritis Maternal Grandmother    Diabetes Maternal Grandmother    Prostate cancer Maternal Grandfather    Leukemia Other    Colon cancer Neg Hx    Esophageal cancer Neg Hx    Rectal cancer Neg Hx    Stomach cancer Neg Hx    Colon polyps Neg Hx     Social History   Socioeconomic History   Marital status: Single    Spouse name: Not on file   Number of children: Not on file   Years of education: Not on file   Highest education level: Not on file  Occupational History   Not on file  Tobacco Use   Smoking status: Never   Smokeless tobacco: Never  Vaping Use   Vaping Use: Never used  Substance and Sexual Activity   Alcohol use: Not Currently    Comment: rare alcohol intake   Drug use: No   Sexual activity: Not on file  Other Topics Concern   Not on file  Social History Narrative   Single, moved to [place] from [place] in 2014 to be closer to her son and grandchildren.   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   Occ alcohol.   No tobacco or drugs.   Active in "play" but no formal exercise regimen.   Social Determinants of Health   Financial Resource Strain: Low  Risk    Difficulty of Paying Living Expenses: Not hard at all  Food Insecurity: No Food Insecurity   Worried About Charity fundraiser in the Last Year: Never true   [place] in the Last Year: Never true  Transportation Needs: No Transportation Needs   Lack of Transportation (Medical): No   Lack of Transportation (Non-Medical): No  Physical Activity: Inactive   Days  of Exercise per Week: 0 days   Minutes of Exercise per Session: 0 min  Stress: No Stress Concern Present   Feeling of Stress : Not at all  Social Connections: Socially Isolated   Frequency of Communication with Friends and Family: More than three times a week   Frequency of Social Gatherings with Friends and Family: More than three times a week   Attends Religious Services: Never   Marine scientist or Organizations: No   Attends Music therapist: Never   Marital Status: Divorced  Human resources officer Violence: Not At Risk   Fear of Current or Ex-Partner: No   Emotionally Abused: No   Physically Abused: No   Sexually Abused: No    Physical Exam: Vital signs in last 24 hours: '@BP'$  (!) 151/79   Pulse 90   Temp 98 F (36.7 C)   Ht '5\' 2"'$  (1.575 m)   Wt 162 lb (73.5 kg)   SpO2 98%   BMI 29.63 kg/m  GEN: NAD EYE: Sclerae anicteric ENT: MMM CV: Non-tachycardic Pulm: CTA b/l GI: Soft, NT/ND NEURO:  Alert & Oriented x 3   Zenovia Jarred, MD [facility]  06/04/2021 10:53 AM

## 2021-06-04 NOTE — Progress Notes (Signed)
Called to room to assist during endoscopic procedure.  Patient ID and intended procedure confirmed with present staff. Received instructions for my participation in the procedure from the performing physician.  

## 2021-06-04 NOTE — Op Note (Signed)
[facility] Patient Name: Kelly Barnett Procedure Date: 06/04/2021 11:04 AM MRN: [id number] Endoscopist: Jerene Bears , MD Age: 70 Referring MD:  Date of Birth: 01-25-1951 Gender: Female Account #: [id number] Procedure:                Colonoscopy Indications:              High risk colon cancer surveillance: Personal                            history of sessile serrated colon polyp (less than                            10 mm in size) with no dysplasia and tubular                            adenoma, Last colonoscopy: March 2017 Medicines:                Monitored Anesthesia Care Procedure:                Pre-Anesthesia Assessment:                           - Prior to the procedure, a History and Physical                            was performed, and patient medications and                            allergies were reviewed. The patient's tolerance of                            previous anesthesia was also reviewed. The risks                            and benefits of the procedure and the sedation                            options and risks were discussed with the patient.                            All questions were answered, and informed consent                            was obtained. Prior Anticoagulants: The patient has                            taken no previous anticoagulant or antiplatelet                            agents. ASA Grade Assessment: II - A patient with                            mild systemic disease. After reviewing the risks  and benefits, the patient was deemed in                            satisfactory condition to undergo the procedure.                           After obtaining informed consent, the colonoscope                            was passed under direct vision. Throughout the                            procedure, the patient's blood pressure, pulse, and                            oxygen saturations were monitored  continuously. The                            Olympus PCF-H190DL [id number]) Colonoscope was                            introduced through the anus and advanced to the                            cecum, identified by appendiceal orifice and                            ileocecal valve. The colonoscopy was performed                            without difficulty. The patient tolerated the                            procedure well. The quality of the bowel                            preparation was good. The ileocecal valve,                            appendiceal orifice, and rectum were photographed. Scope In: 11:07:54 AM Scope Out: 11:25:19 AM Scope Withdrawal Time: 0 hours 15 minutes 12 seconds  Total Procedure Duration: 0 hours 17 minutes 25 seconds  Findings:                 The digital rectal exam was normal.                           A 5 mm polyp was found in the cecum. The polyp was                            sessile. The polyp was removed with a cold snare.                            Resection and retrieval were complete.  Two sessile polyps were found in the ascending                            colon. The polyps were 3 to 6 mm in size. These                            polyps were removed with a cold snare. Resection                            and retrieval were complete.                           A 4 mm polyp was found in the sigmoid colon. The                            polyp was sessile. The polyp was removed with a                            cold snare. Resection and retrieval were complete.                           Multiple small and large-mouthed diverticula were                            found in the sigmoid colon and descending colon.                           Internal hemorrhoids were found during                            retroflexion. The hemorrhoids were medium-sized. Complications:            No immediate complications. Estimated Blood Loss:      Estimated blood loss was minimal. Impression:               - One 5 mm polyp in the cecum, removed with a cold                            snare. Resected and retrieved.                           - Two 3 to 6 mm polyps in the ascending colon,                            removed with a cold snare. Resected and retrieved.                           - One 4 mm polyp in the sigmoid colon, removed with                            a cold snare. Resected and retrieved.                           -  Moderate diverticulosis in the sigmoid colon and                            in the descending colon.                           - Internal hemorrhoids. Recommendation:           - Patient has a contact number available for                            emergencies. The signs and symptoms of potential                            delayed complications were discussed with the                            patient. Return to normal activities tomorrow.                            Written discharge instructions were provided to the                            patient.                           - Resume previous diet.                           - Continue present medications.                           - Await pathology results.                           - Repeat colonoscopy is recommended for                            surveillance. The colonoscopy date will be                            determined after pathology results from today's                            exam become available for review. Jerene Bears, MD 06/04/2021 11:29:12 AM This report has been signed electronically.

## 2021-06-04 NOTE — Progress Notes (Signed)
Vss nad transferred to pacu 

## 2021-06-08 ENCOUNTER — Encounter: Payer: Self-pay | Admitting: Internal Medicine

## 2021-06-13 ENCOUNTER — Ambulatory Visit: Payer: Medicare Other

## 2021-08-29 ENCOUNTER — Other Ambulatory Visit: Payer: Self-pay | Admitting: Family Medicine

## 2021-09-26 ENCOUNTER — Other Ambulatory Visit: Payer: Self-pay | Admitting: Family Medicine

## 2022-03-11 ENCOUNTER — Other Ambulatory Visit: Payer: Self-pay | Admitting: Family Medicine

## 2022-03-11 DIAGNOSIS — Z1231 Encounter for screening mammogram for malignant neoplasm of breast: Secondary | ICD-10-CM

## 2022-03-20 ENCOUNTER — Ambulatory Visit (INDEPENDENT_AMBULATORY_CARE_PROVIDER_SITE_OTHER): Payer: Medicare HMO

## 2022-03-20 DIAGNOSIS — Z1231 Encounter for screening mammogram for malignant neoplasm of breast: Secondary | ICD-10-CM

## 2023-04-22 ENCOUNTER — Other Ambulatory Visit: Payer: Self-pay | Admitting: Family Medicine

## 2023-04-22 DIAGNOSIS — Z1231 Encounter for screening mammogram for malignant neoplasm of breast: Secondary | ICD-10-CM

## 2023-05-28 ENCOUNTER — Ambulatory Visit: Payer: Medicare HMO

## 2023-05-28 DIAGNOSIS — Z1231 Encounter for screening mammogram for malignant neoplasm of breast: Secondary | ICD-10-CM

## 2024-04-21 ENCOUNTER — Other Ambulatory Visit: Payer: Self-pay | Admitting: Family Medicine

## 2024-04-21 DIAGNOSIS — Z1231 Encounter for screening mammogram for malignant neoplasm of breast: Secondary | ICD-10-CM

## 2024-04-26 ENCOUNTER — Other Ambulatory Visit: Payer: Self-pay | Admitting: Radiology

## 2024-06-02 ENCOUNTER — Ambulatory Visit

## 2024-06-03 ENCOUNTER — Ambulatory Visit

## 2024-06-30 ENCOUNTER — Ambulatory Visit (INDEPENDENT_AMBULATORY_CARE_PROVIDER_SITE_OTHER)

## 2024-06-30 DIAGNOSIS — Z1231 Encounter for screening mammogram for malignant neoplasm of breast: Secondary | ICD-10-CM
